# Patient Record
Sex: Female | Born: 1970 | Race: White | Hispanic: No | Marital: Married | Smoking: Never smoker
Health system: Southern US, Community
[De-identification: ages and names within clinical notes are randomized; demographics above are authoritative.]

## PROBLEM LIST (undated history)

## (undated) DIAGNOSIS — F419 Anxiety disorder, unspecified: Secondary | ICD-10-CM

## (undated) DIAGNOSIS — K219 Gastro-esophageal reflux disease without esophagitis: Secondary | ICD-10-CM

## (undated) DIAGNOSIS — I10 Essential (primary) hypertension: Secondary | ICD-10-CM

## (undated) HISTORY — DX: Anxiety disorder, unspecified: F41.9

## (undated) HISTORY — DX: Essential (primary) hypertension: I10

## (undated) HISTORY — PX: WRIST SURGERY: SHX841

## (undated) HISTORY — PX: TUBAL LIGATION: SHX77

## (undated) HISTORY — PX: BREAST SURGERY: SHX581

## (undated) HISTORY — DX: Gastro-esophageal reflux disease without esophagitis: K21.9

---

## 2004-01-08 ENCOUNTER — Ambulatory Visit: Payer: Self-pay

## 2005-03-23 HISTORY — PX: ENDOMETRIAL ABLATION: SHX621

## 2006-01-08 ENCOUNTER — Emergency Department: Payer: Self-pay | Admitting: Emergency Medicine

## 2010-09-19 ENCOUNTER — Ambulatory Visit: Payer: Self-pay | Admitting: Family Medicine

## 2018-06-13 ENCOUNTER — Other Ambulatory Visit: Payer: Self-pay | Admitting: Family Medicine

## 2018-12-23 ENCOUNTER — Other Ambulatory Visit: Payer: Self-pay

## 2018-12-23 DIAGNOSIS — Z20822 Contact with and (suspected) exposure to covid-19: Secondary | ICD-10-CM

## 2018-12-24 LAB — NOVEL CORONAVIRUS, NAA: SARS-CoV-2, NAA: NOT DETECTED

## 2019-01-02 DIAGNOSIS — B9689 Other specified bacterial agents as the cause of diseases classified elsewhere: Secondary | ICD-10-CM | POA: Diagnosis not present

## 2019-01-02 DIAGNOSIS — I10 Essential (primary) hypertension: Secondary | ICD-10-CM | POA: Diagnosis not present

## 2019-01-02 DIAGNOSIS — N898 Other specified noninflammatory disorders of vagina: Secondary | ICD-10-CM | POA: Diagnosis not present

## 2019-01-02 DIAGNOSIS — N760 Acute vaginitis: Secondary | ICD-10-CM | POA: Diagnosis not present

## 2019-01-02 DIAGNOSIS — Z6841 Body Mass Index (BMI) 40.0 and over, adult: Secondary | ICD-10-CM | POA: Diagnosis not present

## 2019-01-09 DIAGNOSIS — I10 Essential (primary) hypertension: Secondary | ICD-10-CM | POA: Diagnosis not present

## 2019-01-09 DIAGNOSIS — Z0000 Encounter for general adult medical examination without abnormal findings: Secondary | ICD-10-CM | POA: Diagnosis not present

## 2019-01-09 DIAGNOSIS — G43809 Other migraine, not intractable, without status migrainosus: Secondary | ICD-10-CM | POA: Diagnosis not present

## 2019-01-09 DIAGNOSIS — Z6841 Body Mass Index (BMI) 40.0 and over, adult: Secondary | ICD-10-CM | POA: Diagnosis not present

## 2019-02-15 ENCOUNTER — Other Ambulatory Visit: Payer: Self-pay

## 2019-02-15 DIAGNOSIS — Z20822 Contact with and (suspected) exposure to covid-19: Secondary | ICD-10-CM

## 2019-02-15 DIAGNOSIS — Z20828 Contact with and (suspected) exposure to other viral communicable diseases: Secondary | ICD-10-CM | POA: Diagnosis not present

## 2019-02-16 LAB — NOVEL CORONAVIRUS, NAA: SARS-CoV-2, NAA: NOT DETECTED

## 2019-04-03 ENCOUNTER — Ambulatory Visit (INDEPENDENT_AMBULATORY_CARE_PROVIDER_SITE_OTHER): Payer: 59 | Admitting: Family Medicine

## 2019-04-03 ENCOUNTER — Encounter: Payer: Self-pay | Admitting: Family Medicine

## 2019-04-03 ENCOUNTER — Other Ambulatory Visit: Payer: Self-pay

## 2019-04-03 VITALS — BP 131/84 | HR 74 | Temp 98.8°F | Ht 60.0 in | Wt 244.0 lb

## 2019-04-03 DIAGNOSIS — K219 Gastro-esophageal reflux disease without esophagitis: Secondary | ICD-10-CM

## 2019-04-03 DIAGNOSIS — F419 Anxiety disorder, unspecified: Secondary | ICD-10-CM

## 2019-04-03 DIAGNOSIS — I10 Essential (primary) hypertension: Secondary | ICD-10-CM

## 2019-04-03 DIAGNOSIS — Z7689 Persons encountering health services in other specified circumstances: Secondary | ICD-10-CM

## 2019-04-03 DIAGNOSIS — R202 Paresthesia of skin: Secondary | ICD-10-CM

## 2019-04-03 DIAGNOSIS — R200 Anesthesia of skin: Secondary | ICD-10-CM

## 2019-04-03 DIAGNOSIS — G4700 Insomnia, unspecified: Secondary | ICD-10-CM

## 2019-04-03 MED ORDER — SUCRALFATE 1 G PO TABS: 1.0000 g | ORAL_TABLET | Freq: Three times a day (TID) | ORAL | 0 refills | Status: DC

## 2019-04-03 MED ORDER — TRAZODONE HCL 50 MG PO TABS: 25.0000 mg | ORAL_TABLET | Freq: Every evening | ORAL | 0 refills | Status: DC | PRN

## 2019-04-03 MED ORDER — PANTOPRAZOLE SODIUM 40 MG PO TBEC: 40.0000 mg | DELAYED_RELEASE_TABLET | Freq: Every day | ORAL | 1 refills | Status: DC

## 2019-04-03 NOTE — Progress Notes (Signed)
BP 131/84   Pulse 74   Temp 98.8 F (37.1 C) (Oral)   Ht 5' (1.524 m)   Wt 244 lb (110.7 kg)   SpO2 98%   BMI 47.65 kg/m    Subjective:    Patient ID: Kari Brandt, female    DOB: 11-Aug-1970, 49 y.o.   MRN: [id number]  HPI: Kari Brandt is a 49 y.o. female  Chief Complaint  Patient presents with  . Establish Care  . Gastroesophageal Reflux   Patient presenting today to establish care.   Dealing with heartburn, indigestion the past 2 weeks. Not taking anything for sxs. Every once in a while has a little bit of brash and belching but never abdominal pain. Sometimes getting nauseated in mornings and throwing up water ut otherwise feeling well.   Right hand itching and numbness, worst when holding objects or laying on that side x 1 month but getting worse. No weakness, color change, known injury, elbow or shoulder pain.   Hx of panic attacks and anxiety, on bad weeks getting 2 per week or so. Has tried hydroxyzine and seroquel in the past but that seemed to keep her awake at night. Has significant insomnia that seems to be resistant to OTC sleep aids as well as these rx's.   HTN - on HCTZ and amlodipine which seems to be controlling things fairly well. Does not check home BPs consistently. Denies CP, SOB, HAs, dizziness, side effects with medications.   Depression screen [facility] 2/9 04/03/2019  Decreased Interest 0  Down, Depressed, Hopeless 0  PHQ - 2 Score 0  Altered sleeping 2  Tired, decreased energy 1  Change in appetite 0  Feeling bad or failure about yourself  0  Trouble concentrating 0  Moving slowly or fidgety/restless 0  Suicidal thoughts 0  PHQ-9 Score 3  No flowsheet data found.  Relevant past medical, surgical, family and social history reviewed and updated as indicated. Interim medical history since our last visit reviewed. Allergies and medications reviewed and updated.  Review of Systems  Per HPI unless specifically indicated above       Objective:    BP 131/84   Pulse 74   Temp 98.8 F (37.1 C) (Oral)   Ht 5' (1.524 m)   Wt 244 lb (110.7 kg)   SpO2 98%   BMI 47.65 kg/m   Wt Readings from Last 3 Encounters:  04/03/19 244 lb (110.7 kg)    Physical Exam Vitals and nursing note reviewed.  Constitutional:      Appearance: Normal appearance. She is not ill-appearing.  HENT:     Head: Atraumatic.  Eyes:     Extraocular Movements: Extraocular movements intact.     Conjunctiva/sclera: Conjunctivae normal.  Cardiovascular:     Rate and Rhythm: Normal rate and regular rhythm.     Heart sounds: Normal heart sounds.  Pulmonary:     Effort: Pulmonary effort is normal.     Breath sounds: Normal breath sounds.  Musculoskeletal:        General: No swelling or tenderness. Normal range of motion.     Cervical back: Normal range of motion and neck supple.  Skin:    General: Skin is warm and dry.  Neurological:     Mental Status: She is alert and oriented to person, place, and time.     Sensory: No sensory deficit.     Motor: No weakness (all 4 extremities full and equal strength).  Psychiatric:  Mood and Affect: Mood normal.        Thought Content: Thought content normal.        Judgment: Judgment normal.     Results for orders placed or performed in visit on 02/15/19  Novel Coronavirus, NAA ([facility])   Specimen: Nasopharyngeal(NP) swabs in vial transport medium   NASOPHARYNGE  TESTING  Result Value Ref Range   SARS-CoV-2, NAA Not Detected Not Detected      Assessment & Plan:   Problem List Items Addressed This Visit      Cardiovascular and Mediastinum   Hypertension    BPs stable and WNL, continue current regimen      Relevant Medications   amLODipine (NORVASC) 5 MG tablet   hydrochlorothiazide (HYDRODIURIL) 25 MG tablet   lisinopril (ZESTRIL) 40 MG tablet     Digestive   GERD (gastroesophageal reflux disease) - Primary    Start protonix and carafate, diet modifications reviewed. F/u if not  resolving      Relevant Medications   pantoprazole (PROTONIX) 40 MG tablet   sucralfate (CARAFATE) 1 g tablet     Other   Anxiety    Start trazodone for sleep and anxiety. Has failed numerous agents in the past so leery about trying new things      Relevant Medications   traZODone (DESYREL) 50 MG tablet   Insomnia    Start trazodone for sleep and anxiety. Has failed numerous agents in the past so leery about trying new things       Other Visit Diagnoses    Encounter to establish care       Numbness and tingling of right arm       Suspect inflammatory. Naproxen, stretches, exercises reviewed. F/u if not resolving       Follow up plan: Return in about 4 weeks (around 05/01/2019) for Anxiety, sleep, and GERD f/u.

## 2019-04-10 DIAGNOSIS — F419 Anxiety disorder, unspecified: Secondary | ICD-10-CM | POA: Insufficient documentation

## 2019-04-10 DIAGNOSIS — I10 Essential (primary) hypertension: Secondary | ICD-10-CM | POA: Insufficient documentation

## 2019-04-10 DIAGNOSIS — G4700 Insomnia, unspecified: Secondary | ICD-10-CM | POA: Insufficient documentation

## 2019-04-10 NOTE — Assessment & Plan Note (Signed)
BPs stable and WNL, continue current regimen 

## 2019-04-10 NOTE — Assessment & Plan Note (Signed)
Start protonix and carafate, diet modifications reviewed. F/u if not resolving

## 2019-04-10 NOTE — Assessment & Plan Note (Signed)
Start trazodone for sleep and anxiety. Has failed numerous agents in the past so leery about trying new things

## 2019-04-10 NOTE — Assessment & Plan Note (Signed)
Start trazodone for sleep and anxiety. Has failed numerous agents in the past so leery about trying new things 

## 2019-04-20 DIAGNOSIS — G43909 Migraine, unspecified, not intractable, without status migrainosus: Secondary | ICD-10-CM | POA: Insufficient documentation

## 2019-05-01 ENCOUNTER — Ambulatory Visit: Payer: Self-pay | Admitting: Family Medicine

## 2019-05-02 ENCOUNTER — Encounter: Payer: Self-pay | Admitting: Family Medicine

## 2019-05-02 ENCOUNTER — Ambulatory Visit: Payer: Self-pay | Admitting: Family Medicine

## 2019-05-02 ENCOUNTER — Telehealth (INDEPENDENT_AMBULATORY_CARE_PROVIDER_SITE_OTHER): Payer: 59 | Admitting: Family Medicine

## 2019-05-02 VITALS — Wt 244.0 lb

## 2019-05-02 DIAGNOSIS — K219 Gastro-esophageal reflux disease without esophagitis: Secondary | ICD-10-CM

## 2019-05-02 DIAGNOSIS — F419 Anxiety disorder, unspecified: Secondary | ICD-10-CM

## 2019-05-02 DIAGNOSIS — N898 Other specified noninflammatory disorders of vagina: Secondary | ICD-10-CM | POA: Diagnosis not present

## 2019-05-02 DIAGNOSIS — G4700 Insomnia, unspecified: Secondary | ICD-10-CM | POA: Diagnosis not present

## 2019-05-02 MED ORDER — TRAZODONE HCL 50 MG PO TABS: 25.0000 mg | ORAL_TABLET | Freq: Every evening | ORAL | 1 refills | Status: DC | PRN

## 2019-05-02 NOTE — Assessment & Plan Note (Signed)
Significant improvement on protonix and prn carafate. Continue prn use of protonix, diet modifications

## 2019-05-02 NOTE — Progress Notes (Signed)
Wt 244 lb (110.7 kg)   BMI 47.65 kg/m    Subjective:    Patient ID: Kari Brandt, female    DOB: 1970-06-07, 49 y.o.   MRN: [id number]  HPI: Kari Brandt is a 49 y.o. female  Chief Complaint  Patient presents with  . Anxiety  . Insomnia  . Gastroesophageal Reflux    . This visit was completed via WebEx due to the restrictions of the COVID-19 pandemic. All issues as above were discussed and addressed. Physical exam was done as above through visual confirmation on WebEx. If it was felt that the patient should be evaluated in the office, they were directed there. The patient verbally consented to this visit. . Location of the patient: home . Location of the provider: home . Those involved with this call:  . Provider: Merrie Roof, PA-C . CMA: Lesle Chris, [place] . Front Desk/Registration: Jill Side  . Time spent on call: 25 minutes with patient face to face via video conference. More than 50% of this time was spent in counseling and coordination of care. 5 minutes total spent in review of patient's record and preparation of their chart. I verified patient identity using two factors (patient name and date of birth). Patient consents verbally to being seen via telemedicine visit today.   Patient following up today on multiple issues.   Taking the trazodone prn at bedtime which seems to work very well for her. Also notes benefit with her anxiety sxs since starting this regimen. Denies side effects, mood concerns.   GERD - significant improvement in sxs with protonix and carafate regimen. No lingering sxs or concerns.   Vaginal discharge with blood, went to walk in clinic several weeks ago for this issue and was treated for BV and yeast with good resolution. Pt mostly concerned because she just dealt with the same issue several months before. Wanting to discuss causes and prevention.   GAD 7 : Generalized Anxiety Score 05/02/2019  Nervous, Anxious, on Edge 1    Control/stop worrying 1  Worry too much - different things 1  Trouble relaxing 2  Restless 0  Easily annoyed or irritable 1  Afraid - awful might happen 0  Total GAD 7 Score 6  Anxiety Difficulty Not difficult at all   Depression screen [facility] 2/9 05/02/2019 04/03/2019  Decreased Interest 0 0  Down, Depressed, Hopeless 0 0  PHQ - 2 Score 0 0  Altered sleeping 1 2  Tired, decreased energy 1 1  Change in appetite 0 0  Feeling bad or failure about yourself  0 0  Trouble concentrating 0 0  Moving slowly or fidgety/restless 0 0  Suicidal thoughts 0 0  PHQ-9 Score 2 3  Difficult doing work/chores Not difficult at all -   Relevant past medical, surgical, family and social history reviewed and updated as indicated. Interim medical history since our last visit reviewed. Allergies and medications reviewed and updated.  Review of Systems  Per HPI unless specifically indicated above     Objective:    Wt 244 lb (110.7 kg)   BMI 47.65 kg/m   Wt Readings from Last 3 Encounters:  05/02/19 244 lb (110.7 kg)  04/03/19 244 lb (110.7 kg)    Physical Exam Vitals and nursing note reviewed.  Constitutional:      General: She is not in acute distress.    Appearance: Normal appearance.  HENT:     Head: Atraumatic.     Right Ear: External ear  normal.     Left Ear: External ear normal.     Nose: Nose normal. No congestion.     Mouth/Throat:     Mouth: Mucous membranes are moist.     Pharynx: Oropharynx is clear. No posterior oropharyngeal erythema.  Eyes:     Extraocular Movements: Extraocular movements intact.     Conjunctiva/sclera: Conjunctivae normal.  Cardiovascular:     Comments: Unable to assess via virtual visit Pulmonary:     Effort: Pulmonary effort is normal. No respiratory distress.  Musculoskeletal:        General: Normal range of motion.     Cervical back: Normal range of motion.  Skin:    General: Skin is dry.     Findings: No erythema.  Neurological:     Mental  Status: She is alert and oriented to person, place, and time.  Psychiatric:        Mood and Affect: Mood normal.        Thought Content: Thought content normal.        Judgment: Judgment normal.     Results for orders placed or performed in visit on 02/15/19  Novel Coronavirus, NAA ([facility])   Specimen: Nasopharyngeal(NP) swabs in vial transport medium   NASOPHARYNGE  TESTING  Result Value Ref Range   SARS-CoV-2, NAA Not Detected Not Detected      Assessment & Plan:   Problem List Items Addressed This Visit      Digestive   GERD (gastroesophageal reflux disease)    Significant improvement on protonix and prn carafate. Continue prn use of protonix, diet modifications        Other   Anxiety    Improved on trazodone, continue current regimen      Relevant Medications   traZODone (DESYREL) 50 MG tablet   Insomnia - Primary    Significant improvement with trazodone nightly prn. Continue current regimen       Other Visit Diagnoses    Vaginal discharge       Discussed probiotics, good vaginal hygiene preventative practices, and to f/u if sxs recurring       Follow up plan: Return in about 6 months (around 10/30/2019) for CPE.

## 2019-05-02 NOTE — Assessment & Plan Note (Signed)
Significant improvement with trazodone nightly prn. Continue current regimen

## 2019-05-02 NOTE — Assessment & Plan Note (Signed)
Improved on trazodone, continue current regimen

## 2019-05-05 ENCOUNTER — Telehealth: Payer: Self-pay | Admitting: Family Medicine

## 2019-05-05 DIAGNOSIS — Z1231 Encounter for screening mammogram for malignant neoplasm of breast: Secondary | ICD-10-CM

## 2019-05-05 NOTE — Telephone Encounter (Signed)
Order placed for mammogram  Copied from CRM [id number]. Topic: Referral - Request for Referral >> May 03, 2019 12:59 PM Lynne Logan D wrote: Has patient seen PCP for this complaint? Yes *If NO, is insurance requiring patient see PCP for this issue before PCP can refer them? Referral for which specialty: Mammography Preferred provider/office: [facility] Reason for referral: Routine

## 2019-05-05 NOTE — Telephone Encounter (Signed)
There is no DPR in pts chart. Lvm for pt to call back.

## 2019-05-08 ENCOUNTER — Other Ambulatory Visit: Payer: Self-pay | Admitting: Family Medicine

## 2019-05-08 DIAGNOSIS — Z1231 Encounter for screening mammogram for malignant neoplasm of breast: Secondary | ICD-10-CM

## 2019-05-09 NOTE — Telephone Encounter (Signed)
LVM for pt to call back.

## 2019-05-11 ENCOUNTER — Telehealth: Payer: Self-pay | Admitting: Family Medicine

## 2019-05-11 MED ORDER — METRONIDAZOLE 0.75 % VA GEL: 1.0000 | Freq: Two times a day (BID) | VAGINAL | 0 refills | Status: DC

## 2019-05-11 NOTE — Telephone Encounter (Signed)
Pt calling stating her vaginal discharge and BV sxs have returned post tx. Will send over flagyl gel and tx for several weeks. F/u in clinic if not resolving

## 2019-05-17 ENCOUNTER — Ambulatory Visit
Admission: RE | Admit: 2019-05-17 | Discharge: 2019-05-17 | Disposition: A | Discharge status: Home / Self Care | Payer: 59 | Source: Ambulatory Visit | Attending: Family Medicine | Admitting: Family Medicine

## 2019-05-17 ENCOUNTER — Other Ambulatory Visit: Payer: Self-pay

## 2019-05-17 ENCOUNTER — Other Ambulatory Visit: Payer: Self-pay | Admitting: Family Medicine

## 2019-05-17 DIAGNOSIS — R2231 Localized swelling, mass and lump, right upper limb: Secondary | ICD-10-CM

## 2019-05-17 DIAGNOSIS — Z1231 Encounter for screening mammogram for malignant neoplasm of breast: Secondary | ICD-10-CM | POA: Diagnosis present

## 2019-05-17 DIAGNOSIS — R928 Other abnormal and inconclusive findings on diagnostic imaging of breast: Secondary | ICD-10-CM

## 2019-05-19 ENCOUNTER — Telehealth: Payer: Self-pay | Admitting: Family Medicine

## 2019-05-19 NOTE — Telephone Encounter (Signed)
Called pt, discussed findings from mammogram of possible mass in right axilla. Discussed that [facility] would be reaching out to discuss results and get her scheduled for f/u ultrasound of right axilla, and without this further imaging we don't really know much based on the mammogram results. Reassurance given, questions answered. Recommended she reach out to [facility] directly to schedule u/s as she had not yet heard from them but to call us Monday afternoon if she still hadn't heard anything from them.

## 2019-05-19 NOTE — Telephone Encounter (Signed)
Routing to provider  

## 2019-05-19 NOTE — Telephone Encounter (Signed)
Copied from CRM [id number]. Topic: General - Other >> May 19, 2019 12:30 PM Tamela Oddi wrote: Reason for CRM: Patient called to discuss her mammogram results with her doctor asap.  Please advise and call back at [phone]

## 2019-05-19 NOTE — Telephone Encounter (Signed)
Already reached out to pt regarding these questions, see other telephone encounter for details

## 2019-05-19 NOTE — Telephone Encounter (Signed)
Patient's husband is calling to ask Fleet Contras about [facility] [employer] [facility]. Something was identified in the scan. Wanting to know what the next step is? Please advise [phone]

## 2019-05-23 ENCOUNTER — Ambulatory Visit
Admission: RE | Admit: 2019-05-23 | Discharge: 2019-05-23 | Disposition: A | Discharge status: Home / Self Care | Payer: 59 | Source: Ambulatory Visit | Attending: Family Medicine | Admitting: Family Medicine

## 2019-05-23 DIAGNOSIS — R928 Other abnormal and inconclusive findings on diagnostic imaging of breast: Secondary | ICD-10-CM

## 2019-05-23 DIAGNOSIS — R2231 Localized swelling, mass and lump, right upper limb: Secondary | ICD-10-CM | POA: Diagnosis present

## 2019-06-28 ENCOUNTER — Ambulatory Visit: Payer: Self-pay | Admitting: *Deleted

## 2019-06-28 NOTE — Telephone Encounter (Addendum)
Pt and her husband called stating that EMS was called to the pt's job today because her she felt tired, and liight headed; her BP per the fire dept was 150/?, sitting, and 160/? Standing; at 1100 per EMS her BP was 105/64 sitting, and 94/50 standing; the pt says that she has not monitored her BP at home; she complains of fatigue that started on 06/24/19, but worsened today; she says that an EKG was also obtained;she was told to contact her PCP; she is currently not light headed; the pt reports that she did take her BP meds this AM; recommendations made per nurse triage protocol; she verbalized understanding; she sees Roosvelt Maser, [facility], but she has no appt within guidelines; pt offered and accepted with [facility], 06/29/19 at 0930; pt also inquired if she could return to work today; advised pt that this needs to be discussed with her provider; she verbalized understanding, and says that her best contact # is [phone] ; will route to office for notification.  Reason for Disposition . [1] Systolic BP 90-110 AND [2] taking blood pressure medications AND [3] NOT dizzy, lightheaded or weak  Answer Assessment - Initial Assessment Questions 1. BLOOD PRESSURE: "What is the blood pressure?" "Did you take at least two measurements 5 minutes apart?"     105/64 and 94/50 2. ONSET: "When did you take your blood pressure?"     06/28/19 at 1100 and 1105 per EMS 3. HOW: "How did you obtain the blood pressure?" (e.g., visiting nurse, automatic home BP monitor)   Obtained per EMS 4. HISTORY: "Do you have a history of low blood pressure?" "What is your blood pressure normally?"     No; pt takes meds for htn  5. MEDICATIONS: "Are you taking any medications for blood pressure?" If yes: "Have they been changed recently?"    Yes, no changes recently 6. PULSE RATE: "Do you know what your pulse rate is?"     84 7. OTHER SYMPTOMS: "Have you been sick recently?" "Have you had a recent injury?"    No, no 8.  PREGNANCY: "Is there any chance you are pregnant?" "When was your last menstrual period?"    No; LMP 06/10/19  Protocols used: LOW BLOOD PRESSURE-A-AH

## 2019-06-29 ENCOUNTER — Other Ambulatory Visit: Payer: Self-pay

## 2019-06-29 ENCOUNTER — Ambulatory Visit (INDEPENDENT_AMBULATORY_CARE_PROVIDER_SITE_OTHER): Payer: 59 | Admitting: Nurse Practitioner

## 2019-06-29 ENCOUNTER — Encounter: Payer: Self-pay | Admitting: Nurse Practitioner

## 2019-06-29 VITALS — BP 115/75 | HR 75 | Temp 98.1°F | Ht 60.0 in | Wt 247.6 lb

## 2019-06-29 DIAGNOSIS — E559 Vitamin D deficiency, unspecified: Secondary | ICD-10-CM | POA: Diagnosis not present

## 2019-06-29 DIAGNOSIS — R5383 Other fatigue: Secondary | ICD-10-CM

## 2019-06-29 DIAGNOSIS — I959 Hypotension, unspecified: Secondary | ICD-10-CM | POA: Diagnosis not present

## 2019-06-29 LAB — UA/M W/RFLX CULTURE, ROUTINE
Bilirubin, UA: NEGATIVE
Glucose, UA: NEGATIVE
Leukocytes,UA: NEGATIVE
Nitrite, UA: NEGATIVE
Protein,UA: NEGATIVE
RBC, UA: NEGATIVE
Specific Gravity, UA: 1.025 (ref 1.005–1.030)
Urobilinogen, Ur: 1 mg/dL (ref 0.2–1.0)
pH, UA: 5.5 (ref 5.0–7.5)

## 2019-06-29 NOTE — Assessment & Plan Note (Addendum)
Acute, ongoing.  Advised to start checking BP at home at least daily and keep a record of the readings.  Unclear cause of hypotension, especially since has been on present BP medications for ~ 3 years.  ?hypoglycemic episode vs. Hypovolemic/dehydrated.  CBC, CMP, TSH, Vitamin D, and UA checked today.  EKG in office within normal limits. Follow up early next week to re-assess blood pressure; Instructed to HOLD blood pressure medications until follow up next week.

## 2019-06-29 NOTE — Progress Notes (Signed)
BP 115/75 (BP Location: Left Arm)   Pulse 75   Temp 98.1 F (36.7 C) (Oral)   Ht 5' (1.524 m)   Wt 247 lb 9.6 oz (112.3 kg)   SpO2 97%   BMI 48.36 kg/m  LMP 06/10/2019; tubal ligation  Subjective:    Patient ID: Kari Brandt, female    DOB: 1971-01-29, 49 y.o.   MRN: [id number]  HPI: Kari Brandt is a 49 y.o. female presenting with blood pressure problem.  Chief Complaint  Patient presents with  . Blood pressure problem    Patient states yesterday while she was at work, she felt light headed. Fire department came and initially her systolic was 621 (doesn't remember dystolic). When EMT arrived her blood pressure was 105/64 sitting-then 94/50 standing.     Reports yesterday she was feeling really tired and lightheaded and extremely hungry.  She never passed out, but the Fire Department was called.  She reports that the Fire department came and checked her blood pressure and it was high.  EMS was also called and reports their readings were significant different than the Fire Department.  She has not taken any of her daily blood pressure medications today.  She does report some sweating during the episode yesterday, but states she "runs hot."  She also endorses some constipation recently.  HYPERTENSION Hypertension status: controlled  Satisfied with current treatment? yes Duration of hypertension: chronic BP monitoring frequency:  not checking BP medication side effects:  no Medication compliance: excellent compliance Previous BP meds:amlodipine and lisinopril-HCTZ Aspirin: no Recurrent headaches: no Visual changes: no Palpitations: no Dyspnea: no Chest pain: no Lower extremity edema: no Dizzy/lightheaded: no  No Known Allergies  Outpatient Encounter Medications as of 06/29/2019  Medication Sig  . amLODipine (NORVASC) 5 MG tablet TAKE 1 TABLET BY MOUTH ONCE DAILY FOR HIGH BLOOD PRESSURE  . hydrochlorothiazide (HYDRODIURIL) 25 MG tablet TAKE 1 TABLET BY MOUTH  ONCE DAILY FOR HIGH BLOOD PRESSURE  . lisinopril (ZESTRIL) 40 MG tablet TAKE 1 TABLET BY MOUTH ONCE DAILY FOR HIGH BLOOD PRESSURE  . naproxen sodium (ALEVE) 220 MG tablet Take by mouth as needed.   . traZODone (DESYREL) 50 MG tablet Take 0.5-1 tablets (25-50 mg total) by mouth at bedtime as needed for sleep.  . pantoprazole (PROTONIX) 40 MG tablet Take 1 tablet (40 mg total) by mouth daily. (Patient not taking: Reported on 06/29/2019)  . sucralfate (CARAFATE) 1 g tablet Take 1 tablet (1 g total) by mouth 4 (four) times daily -  with meals and at bedtime. Can dissolve 1 tablet in full glass of water and drink entire glass if desired (Patient not taking: Reported on 06/29/2019)  . [DISCONTINUED] metroNIDAZOLE (METROGEL VAGINAL) 0.75 % vaginal gel Place 1 Applicatorful vaginally 2 (two) times daily. (Patient not taking: Reported on 06/29/2019)   No facility-administered encounter medications on file as of 06/29/2019.   Patient Active Problem List   Diagnosis Date Noted  . Hypotension 06/29/2019  . Hypertension   . Anxiety   . Insomnia   . GERD (gastroesophageal reflux disease)    Past Medical History:  Diagnosis Date  . Anxiety   . GERD (gastroesophageal reflux disease)   . Hypertension    Relevant past medical, surgical, family and social history reviewed and updated as indicated. Interim medical history since our last visit reviewed.  Review of Systems  Constitutional: Positive for diaphoresis and fatigue. Negative for appetite change, chills and fever.  HENT: Negative.  Negative for congestion,  sinus pressure and sinus pain.   Eyes: Negative.  Negative for visual disturbance.  Respiratory: Negative.  Negative for cough, shortness of breath and wheezing.   Cardiovascular: Negative.  Negative for chest pain and palpitations.  Gastrointestinal: Positive for constipation. Negative for blood in stool, diarrhea, nausea and vomiting.  Endocrine: Negative.  Negative for polydipsia, polyphagia and  polyuria.  Genitourinary: Negative.  Negative for dysuria, frequency, hematuria and urgency.  Skin: Negative.  Negative for color change and pallor.  Neurological: Positive for dizziness. Negative for weakness, light-headedness, numbness and headaches.  Psychiatric/Behavioral: Negative.  Negative for agitation and confusion. The patient is not nervous/anxious.    Per HPI unless specifically indicated above     Objective:    BP 115/75 (BP Location: Left Arm)   Pulse 75   Temp 98.1 F (36.7 C) (Oral)   Ht 5' (1.524 m)   Wt 247 lb 9.6 oz (112.3 kg)   SpO2 97%   BMI 48.36 kg/m   Wt Readings from Last 3 Encounters:  06/29/19 247 lb 9.6 oz (112.3 kg)  05/02/19 244 lb (110.7 kg)  04/03/19 244 lb (110.7 kg)    Orthostatic VS for the past 24 hrs:  BP- Lying Pulse- Lying BP- Sitting Pulse- Sitting BP- Standing at 0 minutes Pulse- Standing at 0 minutes  06/29/19 0945 126/79 74 106/69 76 116/75 83   Physical Exam Vitals and nursing note reviewed.  Constitutional:      Appearance: Normal appearance. She is obese.  HENT:     Head: Normocephalic and atraumatic.  Eyes:     General: No scleral icterus.    Extraocular Movements: Extraocular movements intact.  Cardiovascular:     Rate and Rhythm: Normal rate and regular rhythm.     Pulses: Normal pulses.     Heart sounds: Normal heart sounds. No murmur.  Pulmonary:     Effort: Pulmonary effort is normal. No respiratory distress.     Breath sounds: Normal breath sounds. No wheezing or rhonchi.  Abdominal:     General: Abdomen is flat. Bowel sounds are normal. There is no distension.     Palpations: Abdomen is soft.     Tenderness: There is no abdominal tenderness.  Musculoskeletal:        General: No swelling. Normal range of motion.     Cervical back: Normal range of motion. No rigidity.     Right lower leg: No edema.     Left lower leg: No edema.  Lymphadenopathy:     Cervical: No cervical adenopathy.  Skin:    General: Skin is  warm and dry.     Capillary Refill: Capillary refill takes less than 2 seconds.     Coloration: Skin is not jaundiced or pale.  Neurological:     General: No focal deficit present.     Mental Status: She is alert and oriented to person, place, and time.     Motor: No weakness.     Gait: Gait normal.  Psychiatric:        Mood and Affect: Mood normal.        Behavior: Behavior normal.        Thought Content: Thought content normal.        Judgment: Judgment normal.       Assessment & Plan:   Problem List Items Addressed This Visit      Cardiovascular and Mediastinum   Hypotension - Primary    Acute, ongoing.  Advised to start checking BP at  home at least daily and keep a record of the readings.  Unclear cause of hypotension, especially since has been on present BP medications for ~ 3 years.  ?hypoglycemic episode vs. Hypovolemic/dehydrated.  CBC, CMP, TSH, Vitamin D, and UA checked today.  EKG in office within normal limits. Follow up early next week to re-assess blood pressure; Instructed to HOLD blood pressure medications until follow up next week.      Relevant Orders   CBC with Differential/Platelet   Comprehensive metabolic panel   UA/M w/rflx Culture, Routine   EKG 12-Lead (Completed)    Other Visit Diagnoses    Vitamin D deficiency       Relevant Orders   Vitamin D (25 hydroxy)   Fatigue, unspecified type       Relevant Orders   CBC with Differential/Platelet   Comprehensive metabolic panel   TSH   UA/M w/rflx Culture, Routine   Vitamin D (25 hydroxy)       Follow up plan: Return in about 5 days (around 07/04/2019) for bp check, discuss dizziness.

## 2019-06-29 NOTE — Patient Instructions (Signed)
Hypotension As your heart beats, it forces blood through your body. Hypotension, commonly called low blood pressure, is when the force of blood pumping through your arteries is too weak. Arteries are blood vessels that carry blood from the heart throughout the body. Depending on the cause and severity, hypotension may be harmless (benign) or may cause serious problems (be critical). When blood pressure is too low, you may not get enough blood to your brain or to the rest of your organs. This can cause weakness, light-headedness, rapid heartbeat, and fainting. What are the causes? This condition may be caused by:  Blood loss.  Loss of body fluids (dehydration).  Heart problems.  Hormone (endocrine) problems.  Pregnancy.  Severe infection.  Lack of certain nutrients.  Severe allergic reactions (anaphylaxis).  Certain medicines, such as blood pressure medicine or medicines that make the body lose excess fluids (diuretics). Sometimes, hypotension may be caused by not taking medicine as directed, such as taking too much of a certain medicine. What increases the risk? The following factors may make you more likely to develop this condition:  Age. Risk increases as you get older.  Conditions that affect the heart or the central nervous system.  Taking certain medicines, such as blood pressure medicine or diuretics.  Being pregnant. What are the signs or symptoms? Common symptoms of this condition include:  Weakness.  Light-headedness.  Dizziness.  Blurred vision.  Fatigue.  Rapid heartbeat.  Fainting, in severe cases. How is this diagnosed? This condition is diagnosed based on:  Your medical history.  Your symptoms.  Your blood pressure measurement. Your health care provider will check your blood pressure when you are: ? Lying down. ? Sitting. ? Standing. A blood pressure reading is recorded as two numbers, such as "120 over 80" (or 120/80). The first ("top")  number is called the systolic pressure. It is a measure of the pressure in your arteries as your heart beats. The second ("bottom") number is called the diastolic pressure. It is a measure of the pressure in your arteries when your heart relaxes between beats. Blood pressure is measured in a unit called mm Hg. Healthy blood pressure for most adults is 120/80. If your blood pressure is below 90/60, you may be diagnosed with hypotension. Other information or tests that may be used to diagnose hypotension include:  Your other vital signs, such as your heart rate and temperature.  Blood tests.  Tilt table test. For this test, you will be safely secured to a table that moves you from a lying position to an upright position. Your heart rhythm and blood pressure will be monitored during the test. How is this treated? Treatment for this condition may include:  Changing your diet. This may involve eating more salt (sodium) or drinking more water.  Taking medicines to raise your blood pressure.  Changing the dosage of certain medicines you are taking that might be lowering your blood pressure.  Wearing compression stockings. These stockings help to prevent blood clots and reduce swelling in your legs. In some cases, you may need to go to the hospital for:  Fluid replacement. This means you will receive fluids through an IV.  Blood replacement. This means you will receive donated blood through an IV (transfusion).  Treating an infection or heart problems, if this applies.  Monitoring. You may need to be monitored while medicines that you are taking wear off. Follow these instructions at home: Eating and drinking   Drink enough fluid to keep your   urine pale yellow.  Eat a healthy diet, and follow instructions from your health care provider about eating or drinking restrictions. A healthy diet includes: ? Fresh fruits and vegetables. ? Whole grains. ? Lean meats. ? Low-fat dairy  products.  Eat extra salt only as directed. Do not add extra salt to your diet unless your health care provider told you to do that.  Eat frequent, small meals.  Avoid standing up suddenly after eating. Medicines  Take over-the-counter and prescription medicines only as told by your health care provider. ? Follow instructions from your health care provider about changing the dosage of your current medicines, if this applies. ? Do not stop or adjust any of your medicines on your own. General instructions   Wear compression stockings as told by your health care provider.  Get up slowly from lying down or sitting positions. This gives your blood pressure a chance to adjust.  Avoid hot showers and excessive heat as directed by your health care provider.  Return to your normal activities as told by your health care provider. Ask your health care provider what activities are safe for you.  Do not use any products that contain nicotine or tobacco, such as cigarettes, e-cigarettes, and chewing tobacco. If you need help quitting, ask your health care provider.  Keep all follow-up visits as told by your health care provider. This is important. Contact a health care provider if you:  Vomit.  Have diarrhea.  Have a fever for more than 2-3 days.  Feel more thirsty than usual.  Feel weak and tired. Get help right away if you:  Have chest pain.  Have a fast or irregular heartbeat.  Develop numbness in any part of your body.  Cannot move your arms or your legs.  Have trouble speaking.  Become sweaty or feel light-headed.  Faint.  Feel short of breath.  Have trouble staying awake.  Feel confused. Summary  Hypotension is when the force of blood pumping through your arteries is too weak.  Hypotension may be harmless (benign) or may cause serious problems (be critical).  Treatment for this condition may include changing your diet, changing your medicines, and wearing  compression stockings.  In some cases, you may need to go to the hospital for fluid or blood replacement. This information is not intended to replace advice given to you by your health care provider. Make sure you discuss any questions you have with your health care provider. Document Revised: 09/02/2017 Document Reviewed: 09/02/2017 Elsevier Patient Education  2020 [employer]vier [employer]  

## 2019-06-30 ENCOUNTER — Encounter: Payer: Self-pay | Admitting: Nurse Practitioner

## 2019-06-30 ENCOUNTER — Telehealth: Payer: Self-pay | Admitting: Nurse Practitioner

## 2019-06-30 DIAGNOSIS — E559 Vitamin D deficiency, unspecified: Secondary | ICD-10-CM

## 2019-06-30 DIAGNOSIS — R748 Abnormal levels of other serum enzymes: Secondary | ICD-10-CM

## 2019-06-30 LAB — COMPREHENSIVE METABOLIC PANEL
ALT: 27 IU/L (ref 0–32)
AST: 21 IU/L (ref 0–40)
Albumin/Globulin Ratio: 1.7 (ref 1.2–2.2)
Albumin: 4.5 g/dL (ref 3.8–4.8)
Alkaline Phosphatase: 141 IU/L — ABNORMAL HIGH (ref 39–117)
BUN/Creatinine Ratio: 18 (ref 9–23)
BUN: 12 mg/dL (ref 6–24)
Bilirubin Total: 0.3 mg/dL (ref 0.0–1.2)
CO2: 25 mmol/L (ref 20–29)
Calcium: 9.1 mg/dL (ref 8.7–10.2)
Chloride: 108 mmol/L — ABNORMAL HIGH (ref 96–106)
Creatinine, Ser: 0.66 mg/dL (ref 0.57–1.00)
GFR calc Af Amer: 123 mL/min/{1.73_m2} (ref >59)
GFR calc non Af Amer: 106 mL/min/{1.73_m2} (ref >59)
Globulin, Total: 2.7 g/dL (ref 1.5–4.5)
Glucose: 96 mg/dL (ref 65–99)
Potassium: 4.8 mmol/L (ref 3.5–5.2)
Sodium: 145 mmol/L — ABNORMAL HIGH (ref 134–144)
Total Protein: 7.2 g/dL (ref 6.0–8.5)

## 2019-06-30 LAB — VITAMIN D 25 HYDROXY (VIT D DEFICIENCY, FRACTURES): Vit D, 25-Hydroxy: 15.3 ng/mL — ABNORMAL LOW (ref 30.0–100.0)

## 2019-06-30 LAB — CBC WITH DIFFERENTIAL/PLATELET
Basophils Absolute: 0 10*3/uL (ref 0.0–0.2)
Basos: 0 %
EOS (ABSOLUTE): 0.2 10*3/uL (ref 0.0–0.4)
Eos: 3 %
Hematocrit: 39.9 % (ref 34.0–46.6)
Hemoglobin: 12.8 g/dL (ref 11.1–15.9)
Immature Grans (Abs): 0.1 10*3/uL (ref 0.0–0.1)
Immature Granulocytes: 1 %
Lymphocytes Absolute: 2.7 10*3/uL (ref 0.7–3.1)
Lymphs: 30 %
MCH: 28.8 pg (ref 26.6–33.0)
MCHC: 32.1 g/dL (ref 31.5–35.7)
MCV: 90 fL (ref 79–97)
Monocytes Absolute: 0.6 10*3/uL (ref 0.1–0.9)
Monocytes: 6 %
Neutrophils Absolute: 5.4 10*3/uL (ref 1.4–7.0)
Neutrophils: 60 %
Platelets: 348 10*3/uL (ref 150–450)
RBC: 4.44 x10E6/uL (ref 3.77–5.28)
RDW: 13.1 % (ref 11.7–15.4)
WBC: 9 10*3/uL (ref 3.4–10.8)

## 2019-06-30 LAB — TSH: TSH: 1.62 u[IU]/mL (ref 0.450–4.500)

## 2019-06-30 NOTE — Telephone Encounter (Signed)
CMP and Vitamin D ordered for recheck.

## 2019-07-06 ENCOUNTER — Ambulatory Visit (INDEPENDENT_AMBULATORY_CARE_PROVIDER_SITE_OTHER): Payer: 59 | Admitting: Family Medicine

## 2019-07-06 ENCOUNTER — Other Ambulatory Visit: Payer: Self-pay

## 2019-07-06 ENCOUNTER — Encounter: Payer: Self-pay | Admitting: Family Medicine

## 2019-07-06 VITALS — BP 134/88 | HR 79 | Temp 98.7°F | Wt 245.0 lb

## 2019-07-06 DIAGNOSIS — R42 Dizziness and giddiness: Secondary | ICD-10-CM

## 2019-07-06 DIAGNOSIS — Z23 Encounter for immunization: Secondary | ICD-10-CM

## 2019-07-06 DIAGNOSIS — I10 Essential (primary) hypertension: Secondary | ICD-10-CM | POA: Diagnosis not present

## 2019-07-06 NOTE — Progress Notes (Signed)
BP 134/88   Pulse 79   Temp 98.7 F (37.1 C) (Oral)   Wt 245 lb (111.1 kg)   SpO2 98%   BMI 47.85 kg/m    Subjective:    Patient ID: Kari Brandt, female    DOB: 02-Sep-1970, 49 y.o.   MRN: [id number]  HPI: Kari Brandt is a 49 y.o. female  Chief Complaint  Patient presents with  . Hypertension  . Dizziness   Presenting today for dizziness f/u from last week. Her dizziness was thought to be from overtreatment of BPs, with benign exam, EKG, and labs last week. Home BPs since holding BP regimen x 1 week have been running 140s-160s/80 range. Denies CP, SOB, HAs, dizziness since holding. No other concerns today.   Relevant past medical, surgical, family and social history reviewed and updated as indicated. Interim medical history since our last visit reviewed. Allergies and medications reviewed and updated.  Review of Systems  Per HPI unless specifically indicated above     Objective:    BP 134/88   Pulse 79   Temp 98.7 F (37.1 C) (Oral)   Wt 245 lb (111.1 kg)   SpO2 98%   BMI 47.85 kg/m   Wt Readings from Last 3 Encounters:  07/06/19 245 lb (111.1 kg)  06/29/19 247 lb 9.6 oz (112.3 kg)  05/02/19 244 lb (110.7 kg)    Physical Exam Vitals and nursing note reviewed.  Constitutional:      Appearance: Normal appearance. She is not ill-appearing.  HENT:     Head: Atraumatic.  Eyes:     Extraocular Movements: Extraocular movements intact.     Conjunctiva/sclera: Conjunctivae normal.  Cardiovascular:     Rate and Rhythm: Normal rate and regular rhythm.     Heart sounds: Normal heart sounds.  Pulmonary:     Effort: Pulmonary effort is normal.     Breath sounds: Normal breath sounds.  Musculoskeletal:        General: Normal range of motion.     Cervical back: Normal range of motion and neck supple.  Skin:    General: Skin is warm and dry.  Neurological:     Mental Status: She is alert and oriented to person, place, and time.  Psychiatric:        Mood and Affect: Mood normal.        Thought Content: Thought content normal.        Judgment: Judgment normal.     Results for orders placed or performed in visit on 06/29/19  CBC with Differential/Platelet  Result Value Ref Range   WBC 9.0 3.4 - 10.8 x10E3/uL   RBC 4.44 3.77 - 5.28 x10E6/uL   Hemoglobin 12.8 11.1 - 15.9 g/dL   Hematocrit 39.9 34.0 - 46.6 %   MCV 90 79 - 97 fL   MCH 28.8 26.6 - 33.0 pg   MCHC 32.1 31.5 - 35.7 g/dL   RDW 13.1 11.7 - 15.4 %   Platelets 348 150 - 450 x10E3/uL   Neutrophils 60 Not Estab. %   Lymphs 30 Not Estab. %   Monocytes 6 Not Estab. %   Eos 3 Not Estab. %   Basos 0 Not Estab. %   Neutrophils Absolute 5.4 1.4 - 7.0 x10E3/uL   Lymphocytes Absolute 2.7 0.7 - 3.1 x10E3/uL   Monocytes Absolute 0.6 0.1 - 0.9 x10E3/uL   EOS (ABSOLUTE) 0.2 0.0 - 0.4 x10E3/uL   Basophils Absolute 0.0 0.0 - 0.2 x10E3/uL   Immature  Granulocytes 1 Not Estab. %   Immature Grans (Abs) 0.1 0.0 - 0.1 x10E3/uL  Comprehensive metabolic panel  Result Value Ref Range   Glucose 96 65 - 99 mg/dL   BUN 12 6 - 24 mg/dL   Creatinine, Ser 5.88 0.57 - 1.00 mg/dL   GFR calc non Af Amer 106 >59 mL/min/1.73   GFR calc Af Amer 123 >59 mL/min/1.73   BUN/Creatinine Ratio 18 9 - 23   Sodium 145 (H) 134 - 144 mmol/L   Potassium 4.8 3.5 - 5.2 mmol/L   Chloride 108 (H) 96 - 106 mmol/L   CO2 25 20 - 29 mmol/L   Calcium 9.1 8.7 - 10.2 mg/dL   Total Protein 7.2 6.0 - 8.5 g/dL   Albumin 4.5 3.8 - 4.8 g/dL   Globulin, Total 2.7 1.5 - 4.5 g/dL   Albumin/Globulin Ratio 1.7 1.2 - 2.2   Bilirubin Total 0.3 0.0 - 1.2 mg/dL   Alkaline Phosphatase 141 (H) 39 - 117 IU/L   AST 21 0 - 40 IU/L   ALT 27 0 - 32 IU/L  TSH  Result Value Ref Range   TSH 1.620 0.450 - 4.500 uIU/mL  UA/M w/rflx Culture, Routine   Specimen: Urine   URINE  Result Value Ref Range   Specific Gravity, UA 1.025 1.005 - 1.030   pH, UA 5.5 5.0 - 7.5   Color, UA Yellow Yellow   Appearance Ur Clear Clear    Leukocytes,UA Negative Negative   Protein,UA Negative Negative/Trace   Glucose, UA Negative Negative   Ketones, UA Trace (A) Negative   RBC, UA Negative Negative   Bilirubin, UA Negative Negative   Urobilinogen, Ur 1.0 0.2 - 1.0 mg/dL   Nitrite, UA Negative Negative  Vitamin D (25 hydroxy)  Result Value Ref Range   Vit D, 25-Hydroxy 15.3 (L) 30.0 - 100.0 ng/mL      Assessment & Plan:   Problem List Items Addressed This Visit      Cardiovascular and Mediastinum   Hypertension - Primary    BPs elevated off all 3 medications, will slowly re-add until back consistently at goal. Start by adding HCTZ back in, monitor home readings, call with elevated readings prior to 1 month f/u. Continue hold of lisinopril and amlodipine       Other Visit Diagnoses    Dizziness       Resolved with hold of BP regimen, suspect related to hypotension. Continue close monitoring as slowly adding medication back on for better BP control   Need for Tdap vaccination       Relevant Orders   Tdap vaccine greater than or equal to 7yo IM (Completed)       Follow up plan: Return in about 4 weeks (around 08/03/2019) for HTN.

## 2019-07-06 NOTE — Patient Instructions (Signed)
Start taking back your hydrochlorothiazide but keep holding off on the other two medications for blood pressure

## 2019-07-06 NOTE — Assessment & Plan Note (Signed)
BPs elevated off all 3 medications, will slowly re-add until back consistently at goal. Start by adding HCTZ back in, monitor home readings, call with elevated readings prior to 1 month f/u. Continue hold of lisinopril and amlodipine

## 2019-07-19 ENCOUNTER — Other Ambulatory Visit: Payer: Self-pay | Admitting: Family Medicine

## 2019-07-19 ENCOUNTER — Encounter: Payer: Self-pay | Admitting: Family Medicine

## 2019-07-19 MED ORDER — HYDROCHLOROTHIAZIDE 25 MG PO TABS: 25.0000 mg | ORAL_TABLET | Freq: Every day | ORAL | 1 refills | Status: DC

## 2019-08-03 ENCOUNTER — Other Ambulatory Visit: Payer: Self-pay

## 2019-08-03 ENCOUNTER — Ambulatory Visit (INDEPENDENT_AMBULATORY_CARE_PROVIDER_SITE_OTHER): Payer: Self-pay | Admitting: Family Medicine

## 2019-08-03 ENCOUNTER — Encounter: Payer: Self-pay | Admitting: Family Medicine

## 2019-08-03 VITALS — BP 129/84 | HR 75 | Temp 98.4°F | Ht 60.71 in | Wt 239.5 lb

## 2019-08-03 DIAGNOSIS — I10 Essential (primary) hypertension: Secondary | ICD-10-CM

## 2019-08-03 NOTE — Progress Notes (Signed)
BP 129/84   Pulse 75   Temp 98.4 F (36.9 C)   Ht 5' 0.71" (1.542 m)   Wt 239 lb 8 oz (108.6 kg)   LMP 07/11/2019 (Exact Date)   SpO2 97%   BMI 45.69 kg/m    Subjective:    Patient ID: Kari Brandt, female    DOB: 1970-09-26, 49 y.o.   MRN: [id number]  HPI: Kari Brandt is a 49 y.o. female  Chief Complaint  Patient presents with  . Hypertension   Presenting today for HTN f/u. Had been having dizzy spells that were thought to be related to hypotensive episodes, so all three blood pressure medications had been d/c'd and now HCTZ added back in. Tolerating this well without dizzy spells. Denies Cp, SOB, HAs, syncope. Home BPs running consistently 120s/80s. Trying to eat better and be more active.   Relevant past medical, surgical, family and social history reviewed and updated as indicated. Interim medical history since our last visit reviewed. Allergies and medications reviewed and updated.  Review of Systems  Per HPI unless specifically indicated above     Objective:    BP 129/84   Pulse 75   Temp 98.4 F (36.9 C)   Ht 5' 0.71" (1.542 m)   Wt 239 lb 8 oz (108.6 kg)   LMP 07/11/2019 (Exact Date)   SpO2 97%   BMI 45.69 kg/m   Wt Readings from Last 3 Encounters:  08/03/19 239 lb 8 oz (108.6 kg)  07/06/19 245 lb (111.1 kg)  06/29/19 247 lb 9.6 oz (112.3 kg)    Physical Exam Vitals and nursing note reviewed.  Constitutional:      Appearance: Normal appearance. She is not ill-appearing.  HENT:     Head: Atraumatic.  Eyes:     Extraocular Movements: Extraocular movements intact.     Conjunctiva/sclera: Conjunctivae normal.  Cardiovascular:     Rate and Rhythm: Normal rate and regular rhythm.     Heart sounds: Normal heart sounds.  Pulmonary:     Effort: Pulmonary effort is normal.     Breath sounds: Normal breath sounds.  Musculoskeletal:        General: Normal range of motion.     Cervical back: Normal range of motion and neck supple.   Skin:    General: Skin is warm and dry.  Neurological:     Mental Status: She is alert and oriented to person, place, and time.  Psychiatric:        Mood and Affect: Mood normal.        Thought Content: Thought content normal.        Judgment: Judgment normal.     Results for orders placed or performed in visit on 06/29/19  CBC with Differential/Platelet  Result Value Ref Range   WBC 9.0 3.4 - 10.8 x10E3/uL   RBC 4.44 3.77 - 5.28 x10E6/uL   Hemoglobin 12.8 11.1 - 15.9 g/dL   Hematocrit 39.9 34.0 - 46.6 %   MCV 90 79 - 97 fL   MCH 28.8 26.6 - 33.0 pg   MCHC 32.1 31.5 - 35.7 g/dL   RDW 13.1 11.7 - 15.4 %   Platelets 348 150 - 450 x10E3/uL   Neutrophils 60 Not Estab. %   Lymphs 30 Not Estab. %   Monocytes 6 Not Estab. %   Eos 3 Not Estab. %   Basos 0 Not Estab. %   Neutrophils Absolute 5.4 1.4 - 7.0 x10E3/uL   Lymphocytes Absolute  2.7 0.7 - 3.1 x10E3/uL   Monocytes Absolute 0.6 0.1 - 0.9 x10E3/uL   EOS (ABSOLUTE) 0.2 0.0 - 0.4 x10E3/uL   Basophils Absolute 0.0 0.0 - 0.2 x10E3/uL   Immature Granulocytes 1 Not Estab. %   Immature Grans (Abs) 0.1 0.0 - 0.1 x10E3/uL  Comprehensive metabolic panel  Result Value Ref Range   Glucose 96 65 - 99 mg/dL   BUN 12 6 - 24 mg/dL   Creatinine, Ser 9.37 0.57 - 1.00 mg/dL   GFR calc non Af Amer 106 >59 mL/min/1.73   GFR calc Af Amer 123 >59 mL/min/1.73   BUN/Creatinine Ratio 18 9 - 23   Sodium 145 (H) 134 - 144 mmol/L   Potassium 4.8 3.5 - 5.2 mmol/L   Chloride 108 (H) 96 - 106 mmol/L   CO2 25 20 - 29 mmol/L   Calcium 9.1 8.7 - 10.2 mg/dL   Total Protein 7.2 6.0 - 8.5 g/dL   Albumin 4.5 3.8 - 4.8 g/dL   Globulin, Total 2.7 1.5 - 4.5 g/dL   Albumin/Globulin Ratio 1.7 1.2 - 2.2   Bilirubin Total 0.3 0.0 - 1.2 mg/dL   Alkaline Phosphatase 141 (H) 39 - 117 IU/L   AST 21 0 - 40 IU/L   ALT 27 0 - 32 IU/L  TSH  Result Value Ref Range   TSH 1.620 0.450 - 4.500 uIU/mL  UA/M w/rflx Culture, Routine   Specimen: Urine   URINE  Result  Value Ref Range   Specific Gravity, UA 1.025 1.005 - 1.030   pH, UA 5.5 5.0 - 7.5   Color, UA Yellow Yellow   Appearance Ur Clear Clear   Leukocytes,UA Negative Negative   Protein,UA Negative Negative/Trace   Glucose, UA Negative Negative   Ketones, UA Trace (A) Negative   RBC, UA Negative Negative   Bilirubin, UA Negative Negative   Urobilinogen, Ur 1.0 0.2 - 1.0 mg/dL   Nitrite, UA Negative Negative  Vitamin D (25 hydroxy)  Result Value Ref Range   Vit D, 25-Hydroxy 15.3 (L) 30.0 - 100.0 ng/mL      Assessment & Plan:   Problem List Items Addressed This Visit      Cardiovascular and Mediastinum   Hypertension - Primary    BPs stable and under good control, continue current regimen with close home monitoring          Follow up plan: Return in about 6 months (around 02/03/2020) for CPE.

## 2019-08-09 NOTE — Assessment & Plan Note (Signed)
BPs stable and under good control, continue current regimen with close home monitoring

## 2019-11-06 ENCOUNTER — Ambulatory Visit (LOCAL_COMMUNITY_HEALTH_CENTER): Payer: Self-pay

## 2019-11-06 ENCOUNTER — Encounter: Payer: Medicaid Other | Admitting: Family Medicine

## 2019-11-06 ENCOUNTER — Other Ambulatory Visit: Payer: Self-pay

## 2019-11-06 DIAGNOSIS — Z111 Encounter for screening for respiratory tuberculosis: Secondary | ICD-10-CM

## 2019-11-09 ENCOUNTER — Ambulatory Visit (LOCAL_COMMUNITY_HEALTH_CENTER): Payer: BC Managed Care – PPO | Admitting: Family Medicine

## 2019-11-09 ENCOUNTER — Other Ambulatory Visit: Payer: Self-pay

## 2019-11-09 DIAGNOSIS — Z111 Encounter for screening for respiratory tuberculosis: Secondary | ICD-10-CM

## 2019-11-09 LAB — TB SKIN TEST
Induration: 0 mm
TB Skin Test: NEGATIVE

## 2019-12-04 ENCOUNTER — Ambulatory Visit (INDEPENDENT_AMBULATORY_CARE_PROVIDER_SITE_OTHER): Payer: BC Managed Care – PPO | Admitting: Obstetrics and Gynecology

## 2019-12-04 ENCOUNTER — Encounter: Payer: Self-pay | Admitting: Obstetrics and Gynecology

## 2019-12-04 ENCOUNTER — Other Ambulatory Visit: Payer: Self-pay

## 2019-12-04 VITALS — BP 120/80 | Ht 61.0 in | Wt 236.0 lb

## 2019-12-04 DIAGNOSIS — N939 Abnormal uterine and vaginal bleeding, unspecified: Secondary | ICD-10-CM | POA: Diagnosis not present

## 2019-12-04 NOTE — Progress Notes (Signed)
Particia N[place], [place]   Chief Complaint  Patient presents with  . Vaginal Bleeding    hasnt stopped bleeding since 8/24, bad cramping, heavy flow    HPI:      Ms. Kari Brandt is a 49 y.o. No obstetric history on file. whose LMP was Patient's last menstrual period was 11/14/2019 (exact date)., presents today for NP eval of AUB this cycle. Menses are usually monthly, last 4 days, mod to heavy flow, no BTB, mild dysmen, improved with ibup. Pt had normal menses 5/21, then no menses till 8/21. Bleeding hasn't stopped since. Started to lighten but would then have heavy gushes, soiling clothes. Seems to be tapering off now. Has had worse dysmen compared to normal. Rarely skips a period. S/p Her Option about 2007/2008 without any period relief. Has occas vasomotor sx. Normal thyroid 4/21. Normal pap at [facility] last yr per pt report.   She is sex active, no dyspareunia/bleeding. Pt is s/p TL.  No recent sickness, travel.   Past Medical History:  Diagnosis Date  . Anxiety   . GERD (gastroesophageal reflux disease)   . Hypertension     Past Surgical History:  Procedure Laterality Date  . ENDOMETRIAL ABLATION  2007   her option  . TUBAL LIGATION    . WRIST SURGERY      Family History  Problem Relation Age of Onset  . Diabetes Mother   . Heart disease Mother   . Kidney disease Mother   . Anxiety disorder Sister   . Heart disease Maternal Grandmother   . Breast cancer Neg Hx     Social History   Socioeconomic History  . Marital status: Married    Spouse name: Not on file  . Number of children: Not on file  . Years of education: Not on file  . Highest education level: Not on file  Occupational History  . Not on file  Tobacco Use  . Smoking status: Never Smoker  . Smokeless tobacco: Never Used  Vaping Use  . Vaping Use: Never used  Substance and Sexual Activity  . Alcohol use: Yes    Comment: occassionally  . Drug use: Never  . Sexual activity: Yes     Birth control/protection: None, Surgical    Comment: Tubal Ligation  Other Topics Concern  . Not on file  Social History Narrative  . Not on file   Social Determinants of Health   Financial Resource Strain:   . Difficulty of Paying Living Expenses: Not on file  Food Insecurity:   . Worried About Programme researcher, broadcasting/film/video in the Last Year: Not on file  . Ran Out of Food in the Last Year: Not on file  Transportation Needs:   . Lack of Transportation (Medical): Not on file  . Lack of Transportation (Non-Medical): Not on file  Physical Activity:   . Days of Exercise per Week: Not on file  . Minutes of Exercise per Session: Not on file  Stress:   . Feeling of Stress : Not on file  Social Connections:   . Frequency of Communication with Friends and Family: Not on file  . Frequency of Social Gatherings with Friends and Family: Not on file  . Attends Religious Services: Not on file  . Active Member of Clubs or Organizations: Not on file  . Attends Banker Meetings: Not on file  . Marital Status: Not on file  Intimate Partner Violence:   . Fear of Current or  Ex-Partner: Not on file  . Emotionally Abused: Not on file  . Physically Abused: Not on file  . Sexually Abused: Not on file    Outpatient Medications Prior to Visit  Medication Sig Dispense Refill  . hydrochlorothiazide (HYDRODIURIL) 25 MG tablet Take 1 tablet (25 mg total) by mouth daily. for high blood pressure 90 tablet 1  . naproxen sodium (ALEVE) 220 MG tablet Take by mouth as needed.     . traZODone (DESYREL) 50 MG tablet Take 0.5-1 tablets (25-50 mg total) by mouth at bedtime as needed for sleep. 90 tablet 1   No facility-administered medications prior to visit.      ROS:  Review of Systems  Constitutional: Negative for fever.  Gastrointestinal: Negative for blood in stool, constipation, diarrhea, nausea and vomiting.  Genitourinary: Positive for menstrual problem. Negative for dyspareunia, dysuria, flank  pain, frequency, hematuria, urgency, vaginal bleeding, vaginal discharge and vaginal pain.  Musculoskeletal: Negative for back pain.  Skin: Negative for rash.   BREAST: No symptoms   OBJECTIVE:   Vitals:  BP 120/80   Ht 5\' 1"  (1.549 m)   Wt 236 lb (107 kg)   LMP 11/14/2019 (Exact Date)   BMI 44.59 kg/m   Physical Exam Vitals reviewed.  Constitutional:      Appearance: She is well-developed.  Pulmonary:     Effort: Pulmonary effort is normal.  Genitourinary:    General: Normal vulva.     Pubic Area: No rash.      Labia:        Right: No rash, tenderness or lesion.        Left: No rash, tenderness or lesion.      Vagina: Normal. No vaginal discharge, erythema or tenderness.     Cervix: Normal.     Uterus: Normal. Not enlarged and not tender.      Adnexa: Right adnexa normal and left adnexa normal.       Right: No mass or tenderness.         Left: No mass or tenderness.    Musculoskeletal:        General: Normal range of motion.     Cervical back: Normal range of motion.  Skin:    General: Skin is warm and dry.  Neurological:     General: No focal deficit present.     Mental Status: She is alert and oriented to person, place, and time.  Psychiatric:        Mood and Affect: Mood normal.        Behavior: Behavior normal.        Thought Content: Thought content normal.        Judgment: Judgment normal.     Assessment/Plan: Abnormal uterine bleeding (AUB) - Plan: 11/16/2019 PELVIS TRANSVAGINAL NON-OB (TV ONLY) ; minimal bleeding on exam, tapering off. Check GYN u/s. Will f/u with results. If WNL, will follow cycles expectantly. Pt to f/u if sx worsen before u/s for Rx aygestin prn.     Return in about 2 days (around 12/06/2019) for GYN u/s for AUB--[facility] to call pt.  Kasiya Burck B. Shaquetta Arcos, PA-C 12/04/2019 3:21 PM

## 2019-12-04 NOTE — Patient Instructions (Signed)
I value your feedback and entrusting us with your care. If you get a [facility] patient survey, I would appreciate you taking the time to let us know about your experience today. Thank you!  As of March 02, 2019, your lab results will be released to your MyChart immediately, before I even have a chance to see them. Please give me time to review them and contact you if there are any abnormalities. Thank you for your patience.  

## 2019-12-08 ENCOUNTER — Ambulatory Visit (INDEPENDENT_AMBULATORY_CARE_PROVIDER_SITE_OTHER): Payer: BC Managed Care – PPO

## 2019-12-08 ENCOUNTER — Other Ambulatory Visit: Payer: Self-pay | Admitting: Obstetrics and Gynecology

## 2019-12-08 ENCOUNTER — Other Ambulatory Visit: Payer: Self-pay

## 2019-12-08 DIAGNOSIS — N939 Abnormal uterine and vaginal bleeding, unspecified: Secondary | ICD-10-CM

## 2019-12-12 ENCOUNTER — Telehealth: Payer: Self-pay

## 2019-12-12 NOTE — Telephone Encounter (Signed)
Pt returning call from yesterday about u/s results. Pt states if we can call after [phone]

## 2019-12-12 NOTE — Telephone Encounter (Signed)
Pt aware of GYN u/s results. Neg except thickened EM=14 mm. Pt missed 3 periods and then had 3 wk long menses, heavy flow at times. Bleeding has since stopped.  Discussed possibility of polyp. Can eval with [facility], hysteroscopy in office, or hysteroscopy/D&C. Pt elects to watch next cycle since bleeding has stopped. If sx persist, will eval further.   ULTRASOUND REPORT  Location: [facility]  Date of Service: 12/08/2019    Indications:Abnormal Uterine Bleeding   Findings:  The uterus is anteverted and measures 10.6 x 6.9 x 6.1 cm. Echo texture is heterogenous without evidence of focal masses. The Endometrium measures 14.0 mm. The endometrium is heterogeneous.   Neither ovary is visible.  Survey of the adnexa demonstrates no adnexal masses. There is no free fluid in the cul de sac.  Impression: 1. The endometrium is heterogeneous.  2. An endometrium polyp can not be ruled out.  3. Neither ovary is visible.  4. There is some complex blood within the cervical canal near the external os measuring 21.8 x 3.3 x 12.8 mm.   Recommendations: 1.Clinical correlation with the patient's History and Physical Exam.   Deanna Artis, RT

## 2019-12-30 DIAGNOSIS — N946 Dysmenorrhea, unspecified: Secondary | ICD-10-CM | POA: Diagnosis not present

## 2020-01-12 DIAGNOSIS — Z20822 Contact with and (suspected) exposure to covid-19: Secondary | ICD-10-CM | POA: Diagnosis not present

## 2020-02-04 ENCOUNTER — Encounter: Payer: Self-pay | Admitting: Nurse Practitioner

## 2020-02-04 DIAGNOSIS — Z6841 Body Mass Index (BMI) 40.0 and over, adult: Secondary | ICD-10-CM | POA: Insufficient documentation

## 2020-02-04 DIAGNOSIS — E559 Vitamin D deficiency, unspecified: Secondary | ICD-10-CM | POA: Insufficient documentation

## 2020-02-07 ENCOUNTER — Ambulatory Visit (INDEPENDENT_AMBULATORY_CARE_PROVIDER_SITE_OTHER): Payer: BC Managed Care – PPO | Admitting: Family Medicine

## 2020-02-07 ENCOUNTER — Encounter: Payer: Self-pay | Admitting: Family Medicine

## 2020-02-07 ENCOUNTER — Other Ambulatory Visit: Payer: Self-pay

## 2020-02-07 VITALS — BP 151/89 | HR 69 | Temp 98.6°F | Ht 61.0 in | Wt 236.2 lb

## 2020-02-07 DIAGNOSIS — Z6841 Body Mass Index (BMI) 40.0 and over, adult: Secondary | ICD-10-CM

## 2020-02-07 DIAGNOSIS — K219 Gastro-esophageal reflux disease without esophagitis: Secondary | ICD-10-CM

## 2020-02-07 DIAGNOSIS — I10 Essential (primary) hypertension: Secondary | ICD-10-CM

## 2020-02-07 DIAGNOSIS — F419 Anxiety disorder, unspecified: Secondary | ICD-10-CM

## 2020-02-07 DIAGNOSIS — Z1159 Encounter for screening for other viral diseases: Secondary | ICD-10-CM

## 2020-02-07 DIAGNOSIS — Z0000 Encounter for general adult medical examination without abnormal findings: Secondary | ICD-10-CM

## 2020-02-07 MED ORDER — PANTOPRAZOLE SODIUM 40 MG PO TBEC: 40.0000 mg | DELAYED_RELEASE_TABLET | Freq: Every day | ORAL | 3 refills | Status: DC | PRN

## 2020-02-07 MED ORDER — HYDROCHLOROTHIAZIDE 25 MG PO TABS
25.0000 mg | ORAL_TABLET | Freq: Every day | ORAL | 1 refills | Status: DC
Start: 2020-02-07 — End: 2020-08-15

## 2020-02-07 NOTE — Patient Instructions (Signed)
It was great to see you!  Our plans for today:  - We sent refills of your medications to the pharmacy. - Come back in one month for recheck of your blood pressure. - Work on incorporating more vegetables into your diet. Work on increasing your physical activity. Losing weight will also help your acid reflux.  - We will try to get the records from your recent pap smear.  - Take a calcium and vitamin D supplement.   We are checking some labs today, we will release these to your MyChart.   Take care and seek immediate care sooner if you develop any concerns.   Dr. Linwood Dibbles  Here is an example of what a healthy plate looks like:    ? Make half your plate fruits and vegetables.     ? Focus on whole fruits.     ? Vary your veggies.  ? Make half your grains whole grains. -     ? Look for the word "whole" at the beginning of the ingredients list    ? Some whole-grain ingredients include whole oats, whole-wheat flour,        whole-grain corn, whole-grain brown rice, and whole rye.  ? Move to low-fat and fat-free milk or yogurt.  ? Vary your protein routine. - Meat, fish, poultry (chicken, [place]), eggs, beans (kidney, pinto), dairy.  ? Drink and eat less sodium, saturated fat, and added sugars.

## 2020-02-07 NOTE — Assessment & Plan Note (Signed)
Controlled with prn protonix, refill provided. Counseling provided regarding lifestyle modifications such as avoiding triggering foods and weight loss.

## 2020-02-07 NOTE — Assessment & Plan Note (Signed)
Controlled on current regimen. No changes made today.

## 2020-02-07 NOTE — Assessment & Plan Note (Signed)
Uncontrolled today though has been out of meds. Refills provided today. F/u in one month for recheck and labs.

## 2020-02-07 NOTE — Assessment & Plan Note (Signed)
Counseling provided on lifestyle modifications through diet changes and increasing physical activity, see AVS for details.

## 2020-02-07 NOTE — Progress Notes (Signed)
BP (!) 151/89 (BP Location: Left Arm, Cuff Size: Normal)   Pulse 69   Temp 98.6 F (37 C) (Oral)   Ht 5\' 1"  (1.549 m)   Wt 236 lb 3.2 oz (107.1 kg)   LMP 02/06/2020   SpO2 98%   BMI 44.63 kg/m    Subjective:    Patient ID: Kari Brandt, female    DOB: 14-May-1970, 49 y.o.   MRN: [id number]  HPI: Lissett Favorite is a 49 y.o. female presenting on 02/07/2020 for comprehensive medical examination. Current medical complaints include:none  She currently lives with: Menopausal Symptoms: unsure, occasional hot flashes.. Mom passed at 55yo, unsure of when she went through menopause. A bit more emotional.   Hypertension: - Medications: HCTZ 25mg  daily - Compliance: good but ran out last week. Has not taken any today. - Checking BP at home: yes, 120-140s SBP - Denies any SOB, CP, vision changes, LE edema, medication SEs, or symptoms of hypotension - Diet: not much fruit/vegetable intake. - Exercise: none formally. Active at work (childcare provider)  GERD - Medications: protonix 40mg  prn - Symptoms: heartburn prn - Denies: chest pain, choking on food, cough, difficulty swallowing, dysphagia, early satiety, melena, nausea and regurgitation of undigested food.  - does not smoke.  - worse with sausage, [employer]  Abnormal Uterine Bleeding - G3P3003 - Menses: usually every 4 days. Went 3 months w/o menses this summer, returned in August, heavy. Currently menstruating, heavy. - Contraception: tubal ligation - Cancer screening: UTD, at [facility] normal per patient report - previously saw GYN with pelvic 01-17-1976 notable for endometrial polyp, recommended f/u if wishes to proceed with hysteroscopy and D&C. - has not tried NSAIDs.  Anxiety/Insomnia - Medications: trazodone 25-50mg  at bedtime prn - Taking: every once in a while - Status: improved - Coping Mechanisms: deep breathing, talking to others.   Depression Screen done today and results listed below:  Depression screen  [facility] 2/9 02/07/2020 05/02/2019 04/03/2019  Decreased Interest 0 0 0  Down, Depressed, Hopeless 0 0 0  PHQ - 2 Score 0 0 0  Altered sleeping - 1 2  Tired, decreased energy - 1 1  Change in appetite - 0 0  Feeling bad or failure about yourself  - 0 0  Trouble concentrating - 0 0  Moving slowly or fidgety/restless - 0 0  Suicidal thoughts - 0 0  PHQ-9 Score - 2 3  Difficult doing work/chores - Not difficult at all -    The patient does not have a history of falls. I did not complete a risk assessment for falls. A plan of care for falls was not documented.   Past Medical History:  Past Medical History:  Diagnosis Date  . Anxiety   . GERD (gastroesophageal reflux disease)   . Hypertension     Surgical History:  Past Surgical History:  Procedure Laterality Date  . ENDOMETRIAL ABLATION  2007   her option  . TUBAL LIGATION    . WRIST SURGERY      Medications:  Current Outpatient Medications on File Prior to Visit  Medication Sig  . Ascorbic Acid (VITAMIN C) 1000 MG tablet Take 1,000 mg by mouth daily.  . ASPIRIN 81 PO Take 1 tablet by mouth daily.  . traZODone (DESYREL) 50 MG tablet Take 0.5-1 tablets (25-50 mg total) by mouth at bedtime as needed for sleep.   No current facility-administered medications on file prior to visit.    Allergies:  No Known Allergies  Social History:  Social History   Socioeconomic History  . Marital status: Married    Spouse name: Not on file  . Number of children: Not on file  . Years of education: Not on file  . Highest education level: Not on file  Occupational History  . Not on file  Tobacco Use  . Smoking status: Never Smoker  . Smokeless tobacco: Never Used  Vaping Use  . Vaping Use: Never used  Substance and Sexual Activity  . Alcohol use: Yes    Comment: occassionally  . Drug use: Never  . Sexual activity: Yes    Birth control/protection: None, Surgical    Comment: Tubal Ligation  Other Topics Concern  . Not on file    Social History Narrative  . Not on file   Social Determinants of Health   Financial Resource Strain:   . Difficulty of Paying Living Expenses: Not on file  Food Insecurity:   . Worried About Programme researcher, broadcasting/film/video in the Last Year: Not on file  . Ran Out of Food in the Last Year: Not on file  Transportation Needs:   . Lack of Transportation (Medical): Not on file  . Lack of Transportation (Non-Medical): Not on file  Physical Activity:   . Days of Exercise per Week: Not on file  . Minutes of Exercise per Session: Not on file  Stress:   . Feeling of Stress : Not on file  Social Connections:   . Frequency of Communication with Friends and Family: Not on file  . Frequency of Social Gatherings with Friends and Family: Not on file  . Attends Religious Services: Not on file  . Active Member of Clubs or Organizations: Not on file  . Attends Banker Meetings: Not on file  . Marital Status: Not on file  Intimate Partner Violence:   . Fear of Current or Ex-Partner: Not on file  . Emotionally Abused: Not on file  . Physically Abused: Not on file  . Sexually Abused: Not on file   Social History   Tobacco Use  Smoking Status Never Smoker  Smokeless Tobacco Never Used   Social History   Substance and Sexual Activity  Alcohol Use Yes   Comment: occassionally    Family History:  Family History  Problem Relation Age of Onset  . Diabetes Mother   . Heart disease Mother   . Kidney disease Mother   . Anxiety disorder Sister   . Heart disease Maternal Grandmother   . Breast cancer Neg Hx    Past medical history, surgical history, medications, allergies, family history and social history reviewed with patient today and changes made to appropriate areas of the chart.   Review of Systems - General ROS: negative for - fatigue, fever, night sweats or weight loss All other ROS negative except what is listed above and in the HPI.      Objective:    BP (!) 151/89 (BP  Location: Left Arm, Cuff Size: Normal)   Pulse 69   Temp 98.6 F (37 C) (Oral)   Ht 5\' 1"  (1.549 m)   Wt 236 lb 3.2 oz (107.1 kg)   LMP 02/06/2020   SpO2 98%   BMI 44.63 kg/m   Wt Readings from Last 3 Encounters:  02/07/20 236 lb 3.2 oz (107.1 kg)  12/04/19 236 lb (107 kg)  08/03/19 239 lb 8 oz (108.6 kg)    Physical Exam Vitals reviewed.  Constitutional:      Appearance:  She is obese. She is not ill-appearing.  HENT:     Head: Normocephalic.     Right Ear: External ear normal.     Left Ear: External ear normal.     Nose: Nose normal.     Mouth/Throat:     Mouth: Mucous membranes are moist.  Eyes:     Extraocular Movements: Extraocular movements intact.  Cardiovascular:     Rate and Rhythm: Normal rate and regular rhythm.     Heart sounds: Normal heart sounds.  Pulmonary:     Effort: Pulmonary effort is normal.     Breath sounds: Normal breath sounds. No wheezing or rales.  Abdominal:     General: Bowel sounds are normal.     Palpations: Abdomen is soft.     Tenderness: There is no abdominal tenderness. There is no guarding.  Musculoskeletal:        General: Normal range of motion.     Right lower leg: No edema.     Left lower leg: No edema.  Skin:    General: Skin is warm and dry.  Neurological:     Mental Status: She is alert and oriented to person, place, and time. Mental status is at baseline.  Psychiatric:        Mood and Affect: Mood normal.        Behavior: Behavior normal.     Results for orders placed or performed in visit on 11/06/19  TB Skin Test  Result Value Ref Range   TB Skin Test Negative    Induration 0 mm      Assessment & Plan:   Problem List Items Addressed This Visit      Cardiovascular and Mediastinum   Hypertension    Uncontrolled today though has been out of meds. Refills provided today. F/u in one month for recheck and labs.       Relevant Medications   ASPIRIN 81 PO   hydrochlorothiazide (HYDRODIURIL) 25 MG tablet      Digestive   GERD (gastroesophageal reflux disease)    Controlled with prn protonix, refill provided. Counseling provided regarding lifestyle modifications such as avoiding triggering foods and weight loss.      Relevant Medications   pantoprazole (PROTONIX) 40 MG tablet     Other   Anxiety    Controlled on current regimen. No changes made today.      BMI 40.0-44.9, adult [facility])    Counseling provided on lifestyle modifications through diet changes and increasing physical activity, see AVS for details.       Other Visit Diagnoses    Well adult exam    -  Primary   Need for hepatitis C screening test       Relevant Orders   Hepatitis C antibody       Follow up plan: Return in about 4 weeks (around 03/06/2020) for HTN.   LABORATORY TESTING:  - Pap smear: done elsewhere, [facility] few years ago per patient report  IMMUNIZATIONS:   - Tdap: Tetanus vaccination status reviewed: last tetanus booster within 10 years. - Influenza: Refused - Pneumovax: Not applicable - Prevnar: Not applicable - HPV: Not applicable - Shingrix vaccine: Not applicable  SCREENING: - Mammogram: Up to date, due 05/2020  - Colonoscopy: Not applicable  - Bone Density: Not applicable  -Hearing Test: Not applicable  -Spirometry: Not applicable   PATIENT COUNSELING:   Advised to take 1 mg of folate supplement per day if capable of pregnancy.   Sexuality: Discussed  sexually transmitted diseases, partner selection, use of condoms, avoidance of unintended pregnancy  and contraceptive alternatives.   Advised to avoid cigarette smoking.  I discussed with the patient that most people either abstain from alcohol or drink within safe limits (<=14/week and <=4 drinks/occasion for males, <=7/weeks and <= 3 drinks/occasion for females) and that the risk for alcohol disorders and other health effects rises proportionally with the number of drinks per week and how often a drinker exceeds daily limits.  Discussed  cessation/primary prevention of drug use and availability of treatment for abuse.   Diet: Encouraged to adjust caloric intake to maintain  or achieve ideal body weight, to reduce intake of dietary saturated fat and total fat, to limit sodium intake by avoiding high sodium foods and not adding table salt, and to maintain adequate dietary potassium and calcium preferably from fresh fruits, vegetables, and low-fat dairy products.    stressed the importance of regular exercise  Injury prevention: Discussed safety belts, safety helmets, smoke detector, smoking near bedding or upholstery.   Dental health: Discussed importance of regular tooth brushing, flossing, and dental visits.    NEXT PREVENTATIVE PHYSICAL DUE IN 1 YEAR.  Return in about 4 weeks (around 03/06/2020) for HTN.

## 2020-02-08 LAB — HEPATITIS C ANTIBODY: Hep C Virus Ab: 0.2 s/co ratio (ref 0.0–0.9)

## 2020-03-06 ENCOUNTER — Encounter: Payer: Self-pay | Admitting: Family Medicine

## 2020-03-06 ENCOUNTER — Ambulatory Visit (INDEPENDENT_AMBULATORY_CARE_PROVIDER_SITE_OTHER): Payer: BC Managed Care – PPO | Admitting: Family Medicine

## 2020-03-06 ENCOUNTER — Other Ambulatory Visit: Payer: Self-pay

## 2020-03-06 VITALS — BP 140/80 | HR 76 | Temp 98.3°F | Wt 231.0 lb

## 2020-03-06 DIAGNOSIS — I10 Essential (primary) hypertension: Secondary | ICD-10-CM | POA: Diagnosis not present

## 2020-03-06 DIAGNOSIS — J069 Acute upper respiratory infection, unspecified: Secondary | ICD-10-CM | POA: Diagnosis not present

## 2020-03-06 NOTE — Patient Instructions (Signed)
It was great to see you!  Our plans for today:  - No changes to your medications today.  - Come back in 2 months for recheck.   We are checking some labs today, we will release these results to your MyChart.  You have a cold and it should start to get better about 7 - 10 days after it started.    For your cough, try delsym and honey.   For your nasal congestion and runny nose, try using Afrin (generic is Oxymetazoline) twice daily for 3 days.  Do not use for longer that 3 days.    Some other therapies you can try are: push fluids, rest, gargle warm salt water, use vaporizer or mist as needed and return office visit as needed if symptoms persist or worsen.   Drinking warm liquids such as teas and soups can help with secretions and cough. A mist humidifier or vaporizer can work well to help with secretions and cough.  It is very important to clean the humidifier between use according to the instructions.    It was good to see you.  If you're still having trouble in the next week, come back and see [place].    Of course, if you start having trouble breathing, worsening fevers, vomiting and unable to hold down any fluids, or you have other concerns, don't hesitate to come back or go to the ED after hours.   Dr. Linwood Dibbles

## 2020-03-06 NOTE — Assessment & Plan Note (Signed)
Slightly elevated in office today, likely due to current viral URI and decongestant use given normal readings at home. No med changes made today. Will obtain labs today and reassess in 2 months. If remains elevated in office with normal home readings, consider ambulatory BP monitoring to assess BP patterns.

## 2020-03-06 NOTE — Progress Notes (Signed)
    SUBJECTIVE:   CHIEF COMPLAINT / HPI:   Past Medical History:  Diagnosis Date  . Anxiety   . GERD (gastroesophageal reflux disease)   . Hypertension    Hypertension: - Medications: HCTZ 25mg  daily - Compliance: good - Checking BP at home: not often, SBP 120s  - Denies any SOB, CP, vision changes, LE edema, medication SEs, or symptoms of hypotension  UPPER RESPIRATORY TRACT INFECTION Worst symptom: cough Fever: no Cough: yes, phlegm Shortness of breath: no Wheezing: no Chest pain: no Chest tightness: no Chest congestion: yes Nasal congestion: yes Post nasal drip: yes Sneezing: yes Sore throat: yes Sinus pressure: no Headache: no Face pain: no Ear pain: no   Ear pressure: no   Eyes red/itching:no Eye drainage/crusting: no  Vomiting: no Rash: no 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 No med changes made today. Will obtain labs today and reassess in 2 months. If remains elevated in office with normal home readings, consider ambulatory BP monitoring to assess BP patterns.  Viral URI Symptoms and exam most consistent with viral URI especially daycare work setting. Well appearing and well hydrated on exam, afebrile. Supportive care including OTC symptom relief, maintaining adequate oral hydration, honey/delsym for cough. Return precautions reviewed, see AVS for details.    02/08/2020, DO

## 2020-03-07 LAB — BASIC METABOLIC PANEL
BUN/Creatinine Ratio: 13 (ref 9–23)
BUN: 12 mg/dL (ref 6–24)
CO2: 25 mmol/L (ref 20–29)
Calcium: 9.2 mg/dL (ref 8.7–10.2)
Chloride: 103 mmol/L (ref 96–106)
Creatinine, Ser: 0.93 mg/dL (ref 0.57–1.00)
GFR calc Af Amer: 85 mL/min/{1.73_m2} (ref >59)
GFR calc non Af Amer: 73 mL/min/{1.73_m2} (ref >59)
Glucose: 113 mg/dL — ABNORMAL HIGH (ref 65–99)
Potassium: 4 mmol/L (ref 3.5–5.2)
Sodium: 143 mmol/L (ref 134–144)

## 2020-07-09 ENCOUNTER — Observation Stay
Admission: EM | Admit: 2020-07-09 | Discharge: 2020-07-10 | Disposition: A | Discharge status: Home / Self Care | Payer: BC Managed Care – PPO | Attending: Internal Medicine | Admitting: Internal Medicine

## 2020-07-09 ENCOUNTER — Other Ambulatory Visit: Payer: Self-pay

## 2020-07-09 ENCOUNTER — Telehealth: Payer: Self-pay

## 2020-07-09 ENCOUNTER — Encounter: Payer: Self-pay | Admitting: Emergency Medicine

## 2020-07-09 DIAGNOSIS — R0789 Other chest pain: Secondary | ICD-10-CM | POA: Diagnosis not present

## 2020-07-09 DIAGNOSIS — R778 Other specified abnormalities of plasma proteins: Secondary | ICD-10-CM | POA: Diagnosis not present

## 2020-07-09 DIAGNOSIS — Z20822 Contact with and (suspected) exposure to covid-19: Secondary | ICD-10-CM | POA: Insufficient documentation

## 2020-07-09 DIAGNOSIS — Z79899 Other long term (current) drug therapy: Secondary | ICD-10-CM | POA: Diagnosis not present

## 2020-07-09 DIAGNOSIS — R079 Chest pain, unspecified: Secondary | ICD-10-CM | POA: Diagnosis not present

## 2020-07-09 DIAGNOSIS — F419 Anxiety disorder, unspecified: Secondary | ICD-10-CM | POA: Diagnosis present

## 2020-07-09 DIAGNOSIS — K219 Gastro-esophageal reflux disease without esophagitis: Secondary | ICD-10-CM | POA: Diagnosis present

## 2020-07-09 DIAGNOSIS — I10 Essential (primary) hypertension: Secondary | ICD-10-CM | POA: Diagnosis not present

## 2020-07-09 DIAGNOSIS — Z6841 Body Mass Index (BMI) 40.0 and over, adult: Secondary | ICD-10-CM

## 2020-07-09 LAB — CBC WITH DIFFERENTIAL/PLATELET
Abs Immature Granulocytes: 0.04 10*3/uL (ref 0.00–0.07)
Basophils Absolute: 0 10*3/uL (ref 0.0–0.1)
Basophils Relative: 1 %
Eosinophils Absolute: 0.2 10*3/uL (ref 0.0–0.5)
Eosinophils Relative: 2 %
HCT: 40.4 % (ref 36.0–46.0)
Hemoglobin: 13.6 g/dL (ref 12.0–15.0)
Immature Granulocytes: 1 %
Lymphocytes Relative: 29 %
Lymphs Abs: 2.5 10*3/uL (ref 0.7–4.0)
MCH: 29 pg (ref 26.0–34.0)
MCHC: 33.7 g/dL (ref 30.0–36.0)
MCV: 86.1 fL (ref 80.0–100.0)
Monocytes Absolute: 0.5 10*3/uL (ref 0.1–1.0)
Monocytes Relative: 6 %
Neutro Abs: 5.2 10*3/uL (ref 1.7–7.7)
Neutrophils Relative %: 61 %
Platelets: 355 10*3/uL (ref 150–400)
RBC: 4.69 MIL/uL (ref 3.87–5.11)
RDW: 13.6 % (ref 11.5–15.5)
WBC: 8.4 10*3/uL (ref 4.0–10.5)
nRBC: 0 % (ref 0.0–0.2)

## 2020-07-09 LAB — BASIC METABOLIC PANEL
Anion gap: 8 (ref 5–15)
BUN: 13 mg/dL (ref 6–20)
CO2: 25 mmol/L (ref 22–32)
Calcium: 9.2 mg/dL (ref 8.9–10.3)
Chloride: 107 mmol/L (ref 98–111)
Creatinine, Ser: 0.67 mg/dL (ref 0.44–1.00)
GFR, Estimated: >60 mL/min (ref >60)
Glucose, Bld: 116 mg/dL — ABNORMAL HIGH (ref 70–99)
Potassium: 3.7 mmol/L (ref 3.5–5.1)
Sodium: 140 mmol/L (ref 135–145)

## 2020-07-09 LAB — TROPONIN I (HIGH SENSITIVITY)
Troponin I (High Sensitivity): 29 ng/L — ABNORMAL HIGH (ref <18)
Troponin I (High Sensitivity): 48 ng/L — ABNORMAL HIGH (ref <18)
Troponin I (High Sensitivity): 43 ng/L — ABNORMAL HIGH (ref <18)

## 2020-07-09 MED ORDER — ACETAMINOPHEN 325 MG PO TABS: 650.0000 mg | ORAL_TABLET | ORAL | Status: DC | PRN

## 2020-07-09 MED ORDER — METOPROLOL TARTRATE 25 MG PO TABS
25.0000 mg | ORAL_TABLET | Freq: Two times a day (BID) | ORAL | Status: DC
  Administered 2020-07-09: 25 mg via ORAL
  Filled 2020-07-09: qty 1

## 2020-07-09 MED ORDER — ONDANSETRON HCL 4 MG/2ML IJ SOLN: 4.0000 mg | Freq: Four times a day (QID) | INTRAMUSCULAR | Status: DC | PRN

## 2020-07-09 MED ORDER — ASPIRIN 81 MG PO CHEW: 81.0000 mg | CHEWABLE_TABLET | Freq: Every day | ORAL | Status: DC

## 2020-07-09 MED ORDER — SODIUM CHLORIDE 0.45 % IV SOLN: INTRAVENOUS | Status: DC

## 2020-07-09 MED ORDER — PANTOPRAZOLE SODIUM 40 MG PO TBEC: 40.0000 mg | DELAYED_RELEASE_TABLET | Freq: Every day | ORAL | Status: DC

## 2020-07-09 MED ORDER — TRAZODONE HCL 50 MG PO TABS
25.0000 mg | ORAL_TABLET | Freq: Every evening | ORAL | Status: DC | PRN
  Administered 2020-07-09: 50 mg via ORAL
  Filled 2020-07-09: qty 1

## 2020-07-09 MED ORDER — ASPIRIN 81 MG PO CHEW
324.0000 mg | CHEWABLE_TABLET | Freq: Once | ORAL | Status: AC
  Administered 2020-07-09: 324 mg via ORAL

## 2020-07-09 MED ORDER — HYDROCHLOROTHIAZIDE 25 MG PO TABS: 25.0000 mg | ORAL_TABLET | Freq: Every day | ORAL | Status: DC

## 2020-07-09 MED ORDER — ENOXAPARIN SODIUM 60 MG/0.6ML ~~LOC~~ SOLN
0.5000 mg/kg | SUBCUTANEOUS | Status: DC
  Administered 2020-07-09: 57.5 mg via SUBCUTANEOUS
  Filled 2020-07-09 (×2): qty 0.6

## 2020-07-09 MED ORDER — ALPRAZOLAM 0.25 MG PO TABS: 0.2500 mg | ORAL_TABLET | Freq: Three times a day (TID) | ORAL | Status: DC | PRN

## 2020-07-09 MED ORDER — ENOXAPARIN SODIUM 40 MG/0.4ML ~~LOC~~ SOLN: 40.0000 mg | SUBCUTANEOUS | Status: DC

## 2020-07-09 NOTE — ED Provider Notes (Signed)
[facility] Emergency Department Provider Note   ____________________________________________   Event Date/Time   First MD Initiated Contact with Patient 07/09/20 0915     (approximate)  I have reviewed the triage vital signs and the nursing notes.   HISTORY  Chief Complaint Panic Attack    HPI Kari Brandt is a 50 y.o. female with past medical history of hypertension, anxiety, and GERD who presents to the ED complaining of chest pain.  Patient reports that she became increasingly anxious at work with tightness in her chest along with numbness and tingling in her arms.  She denies any associated shortness of breath, had been feeling well prior to onset of symptoms with no fevers or cough.  She denies any pain or swelling in her legs.  By the time she has arrived to the ED, her chest tightness has resolved.  She describes symptoms today as similar to prior panic attacks.        Past Medical History:  Diagnosis Date  . Anxiety   . GERD (gastroesophageal reflux disease)   . Hypertension     Patient Active Problem List   Diagnosis Date Noted  . BMI 40.0-44.9, adult (HCC) 02/04/2020  . Vitamin D deficiency 02/04/2020  . Migraine 04/20/2019  . Hypertension   . Anxiety   . Insomnia   . GERD (gastroesophageal reflux disease)     Past Surgical History:  Procedure Laterality Date  . ENDOMETRIAL ABLATION  2007   her option  . TUBAL LIGATION    . WRIST SURGERY      Prior to Admission medications   Medication Sig Start Date End Date Taking? Authorizing Provider  Ascorbic Acid (VITAMIN C) 1000 MG tablet Take 1,000 mg by mouth daily.    [provider]  ASPIRIN 81 PO Take 1 tablet by mouth daily.    [provider]  hydrochlorothiazide (HYDRODIURIL) 25 MG tablet Take 1 tablet (25 mg total) by mouth daily. for high blood pressure 02/07/20   Caro Laroche, DO  pantoprazole (PROTONIX) 40 MG tablet Take 1 tablet (40 mg  total) by mouth daily as needed. 02/07/20   Caro Laroche, DO  traZODone (DESYREL) 50 MG tablet Take 0.5-1 tablets (25-50 mg total) by mouth at bedtime as needed for sleep. 05/02/19   Particia Nearing, PA-C  VITAMIN D PO Take by mouth.    [provider]    Allergies Patient has no known allergies.  Family History  Problem Relation Age of Onset  . Diabetes Mother   . Heart disease Mother   . Kidney disease Mother   . Anxiety disorder Sister   . Heart disease Maternal Grandmother   . Breast cancer Neg Hx     Social History Social History   Tobacco Use  . Smoking status: Never Smoker  . Smokeless tobacco: Never Used  Vaping Use  . Vaping Use: Never used  Substance Use Topics  . Alcohol use: Yes    Comment: occassionally  . Drug use: Never    Review of Systems  Constitutional: No fever/chills Eyes: No visual changes. ENT: No sore throat. Cardiovascular: Positive for chest pain. Respiratory: Denies shortness of breath. Gastrointestinal: No abdominal pain.  No nausea, no vomiting.  No diarrhea.  No constipation. Genitourinary: Negative for dysuria. Musculoskeletal: Negative for back pain. Skin: Negative for rash. Neurological: Negative for headaches or focal weakness.  Positive for numbness and tingling.  Bness.  ____________________________________________   PHYSICAL EXAM:  VITAL  SIGNS: ED Triage Vitals [07/09/20 0918]  Enc Vitals Group     BP (!) 173/87     Pulse Rate 97     Resp 20     Temp 98.3 F (36.8 C)     Temp Source Oral     SpO2 97 %     Weight 250 lb (113.4 kg)     Height 5\' 1"  (1.549 m)     Head Circumference      Peak Flow      Pain Score 0     Pain Loc      Pain Edu?      Excl. in GC?     Constitutional: Alert and oriented. Eyes: Conjunctivae are normal. Head: Atraumatic. Nose: No congestion/rhinnorhea. Mouth/Throat: Mucous membranes are moist. Neck: Normal ROM Cardiovascular: Normal rate, regular rhythm. Grossly  normal heart sounds.  2+ radial pulses bilaterally. Respiratory: Normal respiratory effort.  No retractions. Lungs CTAB. Gastrointestinal: Soft and nontender. No distention. Genitourinary: deferred Musculoskeletal: No lower extremity tenderness nor edema. Neurologic:  Normal speech and language. No gross focal neurologic deficits are appreciated. Skin:  Skin is warm, dry and intact. No rash noted. Psychiatric: Mood and affect are normal. Speech and behavior are normal.  ____________________________________________   LABS (all labs ordered are listed, but only abnormal results are displayed)  Labs Reviewed  BASIC METABOLIC PANEL - Abnormal; Notable for the following components:      Result Value   Glucose, Bld 116 (*)    All other components within normal limits  TROPONIN I (HIGH SENSITIVITY) - Abnormal; Notable for the following components:   Troponin I (High Sensitivity) 29 (*)    All other components within normal limits  TROPONIN I (HIGH SENSITIVITY) - Abnormal; Notable for the following components:   Troponin I (High Sensitivity) 48 (*)    All other components within normal limits  SARS CORONAVIRUS 2 (TAT 6-24 HRS)  CBC WITH DIFFERENTIAL/PLATELET   ____________________________________________  EKG  ED ECG REPORT I, , the attending physician, personally viewed and interpreted this ECG.   Date: 07/09/2020  EKG Time: 9:28  Rate: 98  Rhythm: normal sinus rhythm  Axis: Normal  Intervals:none  ST&T Change: Nonspecific T wave changes   PROCEDURES  Procedure(s) performed (including Critical Care):  Procedures   ____________________________________________   INITIAL IMPRESSION / ASSESSMENT AND PLAN / ED COURSE       50 year old female with past medical history of hypertension, GERD, and anxiety who presents to the ED with acute onset episode of tightness in her chest along with numbness and tingling.  She now states that symptoms are resolved and she  denies any cardiac history.  EKG does not show nonspecific T wave changes.  Labs reveal mildly elevated troponin, otherwise unremarkable with no chronic kidney disease to explain troponin elevation.  Troponin slightly uptrending on recheck, although patient remains asymptomatic.  Case discussed with Dr. 57 of cardiology, who recommends admission for observation and stress testing in the morning.  Plan discussed with hospitalist for admission.      ____________________________________________   FINAL CLINICAL IMPRESSION(S) / ED DIAGNOSES  Final diagnoses:  Chest pain, unspecified type  Elevated troponin     ED Discharge Orders    None       Note:  This document was prepared using Dragon voice recognition software and may include unintentional dictation errors.   Okey Dupre, MD 07/09/20 [phone]

## 2020-07-09 NOTE — H&P (Signed)
History and Physical    Kari Brandt XBD:[id number] DOB: Jan 11, 1971 DOA: 07/09/2020  PCP: [name], NP (Confirm with patient/family/NH records and if not entered, this has to be entered at [facility] point of entry) Patient coming from: home/work  I have personally briefly reviewed patient's old medical records in [facility]  Chief Complaint: panic attack, precordial chest pressure  HPI: Kari Brandt is a 50 y.o. female with medical history significant of  hypertension, anxiety, and GERD who presents to the ED complaining of chest pain.  Patient reports that she became increasingly anxious at work with tightness in her chest, described as dull and pressure like along with numbness and tingling in her arms.  She denies any associated shortness of breath, had been feeling well prior to onset of symptoms with no fevers or cough.  She denies any pain or swelling in her legs.  By the time she has arrived to the ED, her chest tightness has resolved.  She describes symptoms today as similar to prior panic attacks. Her husband was concerned and called EMS who transported patient to [facility]  ED Course: T 98.3  173/87  97  20  BMI 47. ED-P exam unremarkable. Bmet glu 116, CBCD nl, Trop #1 29, #2 48. EKG NSR, ST-T wave abnormal, no STEMI. EDP consulted cardiologist who recommended chest pain observation and stress testing. TR[facility] called to admit and manage evaluation.  Review of Systems: As per HPI otherwise 10 point review of systems negative.  S  Past Medical History:  Diagnosis Date  . Anxiety   . GERD (gastroesophageal reflux disease)   . Hypertension     Past Surgical History:  Procedure Laterality Date  . ENDOMETRIAL ABLATION  2007   her option  . TUBAL LIGATION    . WRIST SURGERY      Soc Hx - 1st marriage 9 years - divorced, 2nd marriage in 2nd year. She has 2 daughters and 1 son - ages 29,26,22. 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     reports that  she has never smoked. She has never used smokeless tobacco. She reports current alcohol use. She reports that she does not use drugs.  No Known Allergies  Family History  Problem Relation Age of Onset  . Diabetes Mother   . Heart disease Mother   . Kidney disease Mother   . Anxiety disorder Sister   . Heart disease Maternal Grandmother   . Breast cancer Neg Hx      Prior to Admission medications   Medication Sig Start Date End Date Taking? Authorizing Provider  Ascorbic Acid (VITAMIN C) 1000 MG tablet Take 1,000 mg by mouth daily.   Yes [provider]  ASPIRIN 81 PO Take 1 tablet by mouth daily.   Yes [provider]  hydrochlorothiazide (HYDRODIURIL) 25 MG tablet Take 1 tablet (25 mg total) by mouth daily. for high blood pressure 02/07/20  Yes Caro Laroche, DO  pantoprazole (PROTONIX) 40 MG tablet Take 1 tablet (40 mg total) by mouth daily as needed. 02/07/20  Yes Caro Laroche, DO  traZODone (DESYREL) 50 MG tablet Take 0.5-1 tablets (25-50 mg total) by mouth at bedtime as needed for sleep. 05/02/19  Yes Particia Nearing, PA-C  VITAMIN D PO Take by mouth.   Yes [provider]    Physical Exam: Vitals:   07/09/20 0918  BP: (!) 173/87  Pulse: 97  Resp: 20  Temp: 98.3 F (36.8  C)  TempSrc: Oral  SpO2: 97%  Weight: 113.4 kg  Height: 5\' 1"  (1.549 m)     Vitals:   07/09/20 0918  BP: (!) 173/87  Pulse: 97  Resp: 20  Temp: 98.3 F (36.8 C)  TempSrc: Oral  SpO2: 97%  Weight: 113.4 kg  Height: 5\' 1"  (1.549 m)   General: very pleasant obese woman in no distress Eyes: PERRL, lids and conjunctivae normal ENMT: Mucous membranes are moist. Posterior pharynx clear of any exudate or lesions.Normal dentition.  Neck: normal, supple, no masses, no thyromegaly Respiratory: clear to auscultation bilaterally, no wheezing, no crackles. Normal respiratory effort. No accessory muscle use.  Cardiovascular: Regular rate and rhythm, no murmurs /  rubs / gallops. No extremity edema. 2+ pedal pulses. No carotid bruits.  Abdomen: obese, no tenderness, no masses palpated. No hepatosplenomegaly. Bowel sounds positive.  Musculoskeletal: no clubbing / cyanosis. No joint deformity upper and lower extremities. Good ROM, no contractures. Normal muscle tone.  Skin: no rashes, lesions, ulcers. No induration Neurologic: CN 2-12 grossly intact. Sensation intact, DTR normal. Strength 5/5 in all 4.  Psychiatric: Normal judgment and insight. Alert and oriented x 3. Normal mood.     Labs on Admission: I have personally reviewed following labs and imaging studies  CBC: Recent Labs  Lab 07/09/20 1004  WBC 8.4  NEUTROABS 5.2  HGB 13.6  HCT 40.4  MCV 86.1  PLT 355   Basic Metabolic Panel: Recent Labs  Lab 07/09/20 1004  NA 140  K 3.7  CL 107  CO2 25  GLUCOSE 116*  BUN 13  CREATININE 0.67  CALCIUM 9.2   GFR: Estimated Creatinine Clearance: 101.6 mL/min (by C-G formula based on SCr of 0.67 mg/dL). Liver Function Tests: No results for input(s): AST, ALT, ALKPHOS, BILITOT, PROT, ALBUMIN in the last 168 hours. No results for input(s): LIPASE, AMYLASE in the last 168 hours. No results for input(s): AMMONIA in the last 168 hours. Coagulation Profile: No results for input(s): INR, PROTIME in the last 168 hours. Cardiac Enzymes: No results for input(s): CKTOTAL, CKMB, CKMBINDEX, TROPONINI in the last 168 hours. BNP (last 3 results) No results for input(s): PROBNP in the last 8760 hours. HbA1C: No results for input(s): HGBA1C in the last 72 hours. CBG: No results for input(s): GLUCAP in the last 168 hours. Lipid Profile: No results for input(s): CHOL, HDL, LDLCALC, TRIG, CHOLHDL, LDLDIRECT in the last 72 hours. Thyroid Function Tests: No results for input(s): TSH, T4TOTAL, FREET4, T3FREE, THYROIDAB in the last 72 hours. Anemia Panel: No results for input(s): VITAMINB12, FOLATE, FERRITIN, TIBC, IRON, RETICCTPCT in the last 72  hours. Urine analysis:    Component Value Date/Time   APPEARANCEUR Clear 06/29/2019 1043   GLUCOSEU Negative 06/29/2019 1043   BILIRUBINUR Negative 06/29/2019 1043   PROTEINUR Negative 06/29/2019 1043   NITRITE Negative 06/29/2019 1043   LEUKOCYTESUR Negative 06/29/2019 1043    Radiological Exams on Admission: No results found.  EKG: Independently reviewed. NSR, ST-T wave abnl, no STEMI  Assessment/Plan Active Problems:   Hypertension   Chest pain   GERD (gastroesophageal reflux disease)   Anxiety   BMI 40.0-44.9, adult (HCC)    1. Chest pain - HEART 7: atypical chest pressure, mildly abnl EKG with ST-T wave abnl, 59 y/o, mother had CAD with first symptoms in her 61's and came to CABG, obesity, uncontrolled HTN, mild glucose elevation with Troponins mildly elevated but trending up. Plan Admit for observation to tele bed  Second set of Troponins  Add BB bid  Lexican stress test  Risk factor modification - check lipid panel in AM  2. HTN - on low dose HCTZ. Review of last office visits reveals suboptimal control. Plan Continue HCTZ  Add BB for now and have PCP work with patient on long term regimen  3. BMI 47 - obesity as major risk factor for morbidity. Discussed weight loss with patient PlAN Life style changes: smart food choices, portion size control. Target weight loss of 1-2 lbs per month. Exercise - walk starting at 15 minutes per day working up to 30 minutes  4. Anxiety - during hospital stay Alprazolam 0.25 TID prn    DVT prophylaxis: lovenox  Code Status: full code  Family Communication: Spoke with Kari Brandt, spouse, explaining lab results and Dx plan  Disposition Plan: home 24-48 hrs  Consults called: none - EDP spoke with Card on call, will see if needed  Admission status: obs-tele    Illene Regulus MD [facility] Pager [phone]  If 7PM-7AM, please contact night-coverage [email or web address] Password [facility]  07/09/2020, 2:58 PM

## 2020-07-09 NOTE — Progress Notes (Signed)
PHARMACIST - PHYSICIAN COMMUNICATION  CONCERNING:  Enoxaparin (Lovenox) for DVT Prophylaxis    RECOMMENDATION: Patient was prescribed enoxaprin 40mg  q24 hours for VTE prophylaxis.   Filed Weights   07/09/20 0918  Weight: 113.4 kg (250 lb)    Body mass index is 47.24 kg/m.  Estimated Creatinine Clearance: 101.6 mL/min (by C-G formula based on SCr of 0.67 mg/dL).   Based on [facility] policy patient is candidate for enoxaparin 0.5mg /kg TBW SQ every 24 hours based on BMI being >30.  DESCRIPTION: Pharmacy has adjusted enoxaparin dose per [facility] policy.  Patient is now receiving enoxaparin 57.5 mg every 24 hours    [facility], PharmD Clinical Pharmacist  07/09/2020 2:34 PM

## 2020-07-09 NOTE — Telephone Encounter (Signed)
Copied from CRM [id number]. Topic: General - Other >> Jul 09, 2020  8:42 AM Jaquita Rector A wrote: Reason for CRM: Patient husband Orvilla Fus called in to inform her PCP that she is on the way to the hospital after suffering a panic attack and her BP was 192/90 she is being transported by ambulance. Any questions please call Tommy at Ph# [phone]

## 2020-07-09 NOTE — Telephone Encounter (Signed)
Thank you for the update!

## 2020-07-09 NOTE — ED Triage Notes (Signed)
Pt presents via acems with c/o panic attack while at work. Pt states she became anxious and felt chest tightness while at work. Pt reports symptoms have since then resolved. Pt alert and oriented x4, pt denies chest pain or shortness of breath at this time.

## 2020-07-10 ENCOUNTER — Other Ambulatory Visit: Payer: Self-pay

## 2020-07-10 ENCOUNTER — Encounter: Payer: Self-pay | Admitting: Internal Medicine

## 2020-07-10 ENCOUNTER — Telehealth: Payer: Self-pay

## 2020-07-10 ENCOUNTER — Observation Stay (HOSPITAL_BASED_OUTPATIENT_CLINIC_OR_DEPARTMENT_OTHER)
Admit: 2020-07-10 | Discharge: 2020-07-10 | Disposition: A | Discharge status: Home / Self Care | Payer: BC Managed Care – PPO | Attending: Physician Assistant | Admitting: Physician Assistant

## 2020-07-10 ENCOUNTER — Observation Stay (HOSPITAL_BASED_OUTPATIENT_CLINIC_OR_DEPARTMENT_OTHER): Payer: BC Managed Care – PPO

## 2020-07-10 DIAGNOSIS — I10 Essential (primary) hypertension: Secondary | ICD-10-CM | POA: Diagnosis not present

## 2020-07-10 DIAGNOSIS — Z6841 Body Mass Index (BMI) 40.0 and over, adult: Secondary | ICD-10-CM

## 2020-07-10 DIAGNOSIS — R778 Other specified abnormalities of plasma proteins: Secondary | ICD-10-CM

## 2020-07-10 DIAGNOSIS — R079 Chest pain, unspecified: Secondary | ICD-10-CM

## 2020-07-10 DIAGNOSIS — R7989 Other specified abnormal findings of blood chemistry: Secondary | ICD-10-CM

## 2020-07-10 DIAGNOSIS — F419 Anxiety disorder, unspecified: Secondary | ICD-10-CM

## 2020-07-10 LAB — TROPONIN I (HIGH SENSITIVITY): Troponin I (High Sensitivity): 15 ng/L (ref <18)

## 2020-07-10 LAB — ECHOCARDIOGRAM COMPLETE
AR max vel: 2.6 cm2
AV Area VTI: 3 cm2
AV Area mean vel: 2.52 cm2
AV Mean grad: 3 mmHg
AV Peak grad: 4.7 mmHg
Ao pk vel: 1.08 m/s
Area-P 1/2: 3.31 cm2
Height: 61 in
S' Lateral: 2.77 cm
Weight: 3656 oz

## 2020-07-10 LAB — HIV ANTIBODY (ROUTINE TESTING W REFLEX): HIV Screen 4th Generation wRfx: NONREACTIVE

## 2020-07-10 LAB — NM MYOCAR MULTI W/SPECT W/WALL MOTION / EF
LV dias vol: 89 mL (ref 46–106)
LV sys vol: 31 mL
Peak HR: 112 {beats}/min
Percent HR: 64 %
Rest HR: 69 {beats}/min
SDS: 1
SRS: 0
SSS: 1
TID: 1.16

## 2020-07-10 LAB — LIPID PANEL
Cholesterol: 200 mg/dL (ref 0–200)
HDL: 34 mg/dL — ABNORMAL LOW (ref >40)
LDL Cholesterol: 117 mg/dL — ABNORMAL HIGH (ref 0–99)
Total CHOL/HDL Ratio: 5.9 RATIO
Triglycerides: 244 mg/dL — ABNORMAL HIGH (ref <150)
VLDL: 49 mg/dL — ABNORMAL HIGH (ref 0–40)

## 2020-07-10 LAB — SARS CORONAVIRUS 2 (TAT 6-24 HRS): SARS Coronavirus 2: NEGATIVE

## 2020-07-10 MED ORDER — REGADENOSON 0.4 MG/5ML IV SOLN
0.4000 mg | Freq: Once | INTRAVENOUS | Status: AC
  Administered 2020-07-10: 0.4 mg via INTRAVENOUS

## 2020-07-10 MED ORDER — ALPRAZOLAM 0.25 MG PO TABS: 0.2500 mg | ORAL_TABLET | Freq: Two times a day (BID) | ORAL | 0 refills | Status: AC | PRN

## 2020-07-10 MED ORDER — TECHNETIUM TC 99M TETROFOSMIN IV KIT
30.0000 | PACK | Freq: Once | INTRAVENOUS | Status: AC | PRN
  Administered 2020-07-10: 30.407 via INTRAVENOUS

## 2020-07-10 MED ORDER — TECHNETIUM TC 99M TETROFOSMIN IV KIT
10.0000 | PACK | Freq: Once | INTRAVENOUS | Status: AC | PRN
  Administered 2020-07-10: 9.894 via INTRAVENOUS

## 2020-07-10 NOTE — Discharge Summary (Signed)
Physician Discharge Summary  Kari Brandt EGB:[id number] DOB: 10-01-1970 DOA: 07/09/2020  PCP: [name], NP  Admit date: 07/09/2020 Discharge date: 07/10/2020  Admitted From: Home Disposition: Home  Recommendations for Outpatient Follow-up:  1. Follow up with PCP in 1-2 weeks   Home Health: No Equipment/Devices: None Discharge Condition: Stable CODE STATUS: Full Diet recommendation: Heart Healthy  Brief/Interim Summary: Kari Brandt is a 50 y.o. female with medical history significant of hypertension, anxiety, and GERD who presents to the ED complaining of chest pain. Patient reports that she became increasingly anxious at work with tightness in her chest, described as dull and pressure like along with numbness and tingling in her arms. She denies any associated shortness of breath, had been feeling well prior to onset of symptoms with no fevers or cough. She denies any pain or swelling in her legs. By the time she has arrived to the ED, her chest tightness has resolved. She describes symptoms today as similar to prior panic attacks. Her husband was concerned and called EMS who transported patient to [facility]  Upon arrival no indication of ACS.  Cardiology consulted by EDP who recommended admission for observation and a.m. stress testing.  Patient underwent TTE and nuclear stress test on 4/20.  Results of both are reassuring.  Case discussed with cardiology.  No outpatient cardiology follow-up recommended at this time.  Per my discussion with the patient there were no clear triggering factors to her events.  She states they occasionally wake her up from sleep.  I strongly encouraged her to follow-up with her primary care physician and discuss some of these concerns.  If she is determined to be appropriate a trial of buspirone may be warranted for generalized anxiety disorder.  At time of discharge will prescribe low-dose as needed Xanax 0.25 mg twice daily to address  any potential anxiety attacks post discharge.   Discharge Diagnoses:  Active Problems:   GERD (gastroesophageal reflux disease)   Hypertension   Anxiety   BMI 40.0-44.9, adult (HCC)   Chest pain   Elevated troponin  1. Chest pain - HEART 7: atypical chest pressure, mildly abnl EKG with ST-T wave abnl, 57 y/o, mother had CAD with first symptoms in her 36's and came to CABG, obesity, uncontrolled HTN, mild glucose elevation with Troponins mildly elevated.  No indication of ACS.  Patient underwent echocardiogram and nuclear stress test.  Both were within normal limits per cardiology evaluation.  Patient stable for discharge home at this time.  No cardiology follow-up recommended.  Patient recommended to follow-up with her PCP within a week of discharge.  2. HTN - on low dose HCTZ.  We will not add new medications at this time as hypertension likely augmented by a high anxiety state.  Follow-up outpatient PCP to discuss optimal control of blood pressure.   3. BMI 47 - obesity as major risk factor for morbidity. Discussed weight loss with patient  4. Anxiety -as needed low-dose Xanax 25 twice daily x5 days prescribed at time of discharge.  Follow-up outpatient PCP.  If patient deemed to meet criteria for generalized anxiety disorder she may be a good candidate for trial of buspirone.  Discharge Instructions  Discharge Instructions    Diet - low sodium heart healthy   Complete by: As directed    Increase activity slowly   Complete by: As directed      Allergies as of 07/10/2020   No Known Allergies     Medication List  TAKE these medications   ALPRAZolam 0.25 MG tablet Commonly known as: XANAX Take 1 tablet (0.25 mg total) by mouth 2 (two) times daily as needed for up to 5 days for anxiety.   ASPIRIN 81 PO Take 1 tablet by mouth daily.   hydrochlorothiazide 25 MG tablet Commonly known as: HYDRODIURIL Take 1 tablet (25 mg total) by mouth daily. for high blood pressure    pantoprazole 40 MG tablet Commonly known as: PROTONIX Take 1 tablet (40 mg total) by mouth daily as needed.   traZODone 50 MG tablet Commonly known as: DESYREL Take 0.5-1 tablets (25-50 mg total) by mouth at bedtime as needed for sleep.   vitamin C 1000 MG tablet Take 1,000 mg by mouth daily.   VITAMIN D PO Take by mouth.       Follow-up Information    [name], NP. Schedule an appointment as soon as possible for a visit in 1 week(s).   Specialty: Nurse Practitioner Contact information: [address] [place] [address] [phone]              No Known Allergies  Consultations:  Cardiology-[facility]   Procedures/Studies: NM Myocar Multi W/Spect W/Wall Motion / EF  Result Date: 07/10/2020  T wave inversion was noted during stress in the II, III, aVF, V3, V4, V5 and V6 leads, beginning at 1 minutes of stress.  There was no ST segment deviation noted during stress.  The study is normal.  This is a low risk study.  The left ventricular ejection fraction is hyperdynamic (77%).  There is no evidence for ischemia    ECHOCARDIOGRAM COMPLETE  Result Date: 07/10/2020    ECHOCARDIOGRAM REPORT   Patient Name:   [facility] Date of Exam: 07/10/2020 Medical Rec #:  [id number]            Height:       61.0 in Accession #:    [id number]           Weight:       228.5 lb Date of Birth:  [birth year]            BSA:          1.999 m Patient Age:    49 years             BP:           140/76 mmHg Patient Gender: F                    HR:           73 bpm. Exam Location:  [facility] Procedure: 2D Echo, Cardiac Doppler and Color Doppler Indications:     Chest pain R07.9  History:         Patient has no prior history of Echocardiogram examinations.                  Risk Factors:Hypertension.  Sonographer:     Cristela Blue RDCS (AE) Referring Phys:  [id number] Sondra Barges Diagnosing Phys: Debbe Odea MD  Sonographer Comments: Suboptimal apical window. IMPRESSIONS  1. Left  ventricular ejection fraction, by estimation, is 60 to 65%. The left ventricle has normal function. The left ventricle has no regional wall motion abnormalities. Left ventricular diastolic parameters were normal.  2. Right ventricular systolic function is normal. The right ventricular size is normal.  3. The mitral valve is normal in structure. No evidence of mitral valve regurgitation.  4. The aortic  valve was not well visualized. Aortic valve regurgitation is not visualized. FINDINGS  Left Ventricle: Left ventricular ejection fraction, by estimation, is 60 to 65%. The left ventricle has normal function. The left ventricle has no regional wall motion abnormalities. The left ventricular internal cavity size was normal in size. There is  no left ventricular hypertrophy. Left ventricular diastolic parameters were normal. Right Ventricle: The right ventricular size is normal. No increase in right ventricular wall thickness. Right ventricular systolic function is normal. Left Atrium: Left atrial size was normal in size. Right Atrium: Right atrial size was normal in size. Pericardium: There is no evidence of pericardial effusion. Mitral Valve: The mitral valve is normal in structure. No evidence of mitral valve regurgitation. Tricuspid Valve: The tricuspid valve is grossly normal. Tricuspid valve regurgitation is not demonstrated. Aortic Valve: The aortic valve was not well visualized. Aortic valve regurgitation is not visualized. Aortic valve mean gradient measures 3.0 mmHg. Aortic valve peak gradient measures 4.7 mmHg. Aortic valve area, by VTI measures 3.00 cm. Pulmonic Valve: The pulmonic valve was normal in structure. Pulmonic valve regurgitation is not visualized. Aorta: The aortic root is normal in size and structure. Venous: The inferior vena cava was not well visualized. IAS/Shunts: No atrial level shunt detected by color flow Doppler.  LEFT VENTRICLE PLAX 2D LVIDd:         3.80 cm  Diastology LVIDs:          2.77 cm  LV e' medial:    5.66 cm/s LV PW:         1.54 cm  LV E/e' medial:  12.7 LV IVS:        1.08 cm  LV e' lateral:   5.77 cm/s LVOT diam:     2.00 cm  LV E/e' lateral: 12.5 LV SV:         66 LV SV Index:   33 LVOT Area:     3.14 cm  RIGHT VENTRICLE RV Basal diam:  3.15 cm RV S prime:     14.80 cm/s TAPSE (M-mode): 3.5 cm LEFT ATRIUM             Index       RIGHT ATRIUM           Index LA diam:        4.10 cm 2.05 cm/m  RA Area:     18.60 cm LA Vol (A2C):   37.5 ml 18.76 ml/m RA Volume:   57.00 ml  28.52 ml/m LA Vol (A4C):   56.5 ml 28.27 ml/m LA Biplane Vol: 50.0 ml 25.02 ml/m  AORTIC VALVE                   PULMONIC VALVE AV Area (Vmax):    2.60 cm    PV Vmax:        0.90 m/s AV Area (Vmean):   2.52 cm    PV Peak grad:   3.2 mmHg AV Area (VTI):     3.00 cm    RVOT Peak grad: 4 mmHg AV Vmax:           108.00 cm/s AV Vmean:          77.300 cm/s AV VTI:            0.220 m AV Peak Grad:      4.7 mmHg AV Mean Grad:      3.0 mmHg LVOT Vmax:         89.40 cm/s LVOT  Vmean:        62.100 cm/s LVOT VTI:          0.210 m LVOT/AV VTI ratio: 0.95  AORTA Ao Root diam: 3.33 cm MITRAL VALVE               TRICUSPID VALVE MV Area (PHT): 3.31 cm    TR Peak grad:   14.6 mmHg MV Decel Time: 229 msec    TR Vmax:        191.00 cm/s MV E velocity: 72.00 cm/s MV A velocity: 69.40 cm/s  SHUNTS MV E/A ratio:  1.04        Systemic VTI:  0.21 m                            Systemic Diam: 2.00 cm Debbe Odea MD Electronically signed by Debbe Odea MD Signature Date/Time: 07/10/2020/2:07:32 PM    Final     (Echo, Carotid, EGD, Colonoscopy, ERCP)    Subjective: Seen and examined on the day of discharge.  Stable, no distress.  Chest pain resolved.  Stable for discharge home.  Discharge Exam: Vitals:   07/10/20 0431 07/10/20 0756  BP: 131/76 140/76  Pulse: 72 73  Resp: 19 18  Temp: 98.3 F (36.8 C) 98.3 F (36.8 C)  SpO2: 98% 98%   Vitals:   07/10/20 0331 07/10/20 0333 07/10/20 0431 07/10/20 0756  BP:  115/60  131/76 140/76  Pulse: 70  72 73  Resp: 14  19 18   Temp: 98.2 F (36.8 C)  98.3 F (36.8 C) 98.3 F (36.8 C)  TempSrc: Oral  Oral   SpO2: 96% 96% 98% 98%  Weight:   103.6 kg   Height:        General: Pt is alert, awake, not in acute distress Cardiovascular: RRR, S1/S2 +, no rubs, no gallops Respiratory: CTA bilaterally, no wheezing, no rhonchi Abdominal: Soft, NT, ND, bowel sounds + Extremities: no edema, no cyanosis    The results of significant diagnostics from this hospitalization (including imaging, microbiology, ancillary and laboratory) are listed below for reference.     Microbiology: Recent Results (from the past 240 hour(s))  SARS CORONAVIRUS 2 (TAT 6-24 HRS) Nasopharyngeal Nasopharyngeal Swab     Status: None   Collection Time: 07/09/20  1:28 PM   Specimen: Nasopharyngeal Swab  Result Value Ref Range Status   SARS Coronavirus 2 NEGATIVE NEGATIVE Final    Comment: (NOTE) SARS-CoV-2 target nucleic acids are NOT DETECTED.  The SARS-CoV-2 RNA is generally detectable in upper and lower respiratory specimens during the acute phase of infection. Negative results do not preclude SARS-CoV-2 infection, do not rule out co-infections with other pathogens, and should not be used as the sole basis for treatment or other patient management decisions. Negative results must be combined with clinical observations, patient history, and epidemiological information. The expected result is Negative.  Fact Sheet for Patients: 07/11/20  Fact Sheet for Healthcare Providers: HairSlick.no  This test is not yet approved or cleared by the [place] FDA and  has been authorized for detection and/or diagnosis of SARS-CoV-2 by FDA under an Emergency Use Authorization (EUA). This EUA will remain  in effect (meaning this test can be used) for the duration of the COVID-19 declaration under Se ction 564(b)(1) of the  Act, 21 U.S.C. section 360bbb-3(b)(1), unless the authorization is terminated or revoked sooner.  Performed at [facility], [address], [place], [place]  [address]      Labs: BNP (last 3 results) No results for input(s): BNP in the last 8760 hours. Basic Metabolic Panel: Recent Labs  Lab 07/09/20 1004  NA 140  K 3.7  CL 107  CO2 25  GLUCOSE 116*  BUN 13  CREATININE 0.67  CALCIUM 9.2   Liver Function Tests: No results for input(s): AST, ALT, ALKPHOS, BILITOT, PROT, ALBUMIN in the last 168 hours. No results for input(s): LIPASE, AMYLASE in the last 168 hours. No results for input(s): AMMONIA in the last 168 hours. CBC: Recent Labs  Lab 07/09/20 1004  WBC 8.4  NEUTROABS 5.2  HGB 13.6  HCT 40.4  MCV 86.1  PLT 355   Cardiac Enzymes: No results for input(s): CKTOTAL, CKMB, CKMBINDEX, TROPONINI in the last 168 hours. BNP: Invalid input(s): POCBNP CBG: No results for input(s): GLUCAP in the last 168 hours. D-Dimer No results for input(s): DDIMER in the last 72 hours. Hgb A1c No results for input(s): HGBA1C in the last 72 hours. Lipid Profile Recent Labs    07/10/20 0447  CHOL 200  HDL 34*  LDLCALC 117*  TRIG 244*  CHOLHDL 5.9   Thyroid function studies No results for input(s): TSH, T4TOTAL, T3FREE, THYROIDAB in the last 72 hours.  Invalid input(s): FREET3 Anemia work up No results for input(s): VITAMINB12, FOLATE, FERRITIN, TIBC, IRON, RETICCTPCT in the last 72 hours. Urinalysis    Component Value Date/Time   APPEARANCEUR Clear 06/29/2019 1043   GLUCOSEU Negative 06/29/2019 1043   BILIRUBINUR Negative 06/29/2019 1043   PROTEINUR Negative 06/29/2019 1043   NITRITE Negative 06/29/2019 1043   LEUKOCYTESUR Negative 06/29/2019 1043   Sepsis Labs Invalid input(s): PROCALCITONIN,  WBC,  LACTICIDVEN Microbiology Recent Results (from the past 240 hour(s))  SARS CORONAVIRUS 2 (TAT 6-24 HRS) Nasopharyngeal Nasopharyngeal Swab     Status: None    Collection Time: 07/09/20  1:28 PM   Specimen: Nasopharyngeal Swab  Result Value Ref Range Status   SARS Coronavirus 2 NEGATIVE NEGATIVE Final    Comment: (NOTE) SARS-CoV-2 target nucleic acids are NOT DETECTED.  The SARS-CoV-2 RNA is generally detectable in upper and lower respiratory specimens during the acute phase of infection. Negative results do not preclude SARS-CoV-2 infection, do not rule out co-infections with other pathogens, and should not be used as the sole basis for treatment or other patient management decisions. Negative results must be combined with clinical observations, patient history, and epidemiological information. The expected result is Negative.  Fact Sheet for Patients: HairSlick.no  Fact Sheet for Healthcare Providers: quierodirigir.com  This test is not yet approved or cleared by the [place] FDA and  has been authorized for detection and/or diagnosis of SARS-CoV-2 by FDA under an Emergency Use Authorization (EUA). This EUA will remain  in effect (meaning this test can be used) for the duration of the COVID-19 declaration under Se ction 564(b)(1) of the Act, 21 U.S.C. section 360bbb-3(b)(1), unless the authorization is terminated or revoked sooner.  Performed at [facility], [address] [place], [place] [address]      Time coordinating discharge: Over 30 minutes  SIGNED:   Tresa Moore, MD  [facility] 07/10/2020, 2:36 PM Pager   If 7PM-7AM, please contact night-coverage

## 2020-07-10 NOTE — ED Notes (Signed)
Pt up to restroom without difficulty- ambulated independently. 1x void

## 2020-07-10 NOTE — Telephone Encounter (Signed)
Patient notified

## 2020-07-10 NOTE — Progress Notes (Signed)
*  PRELIMINARY RESULTS* Echocardiogram 2D Echocardiogram has been performed.  Kari Brandt 07/10/2020, 1:26 PM

## 2020-07-10 NOTE — Plan of Care (Signed)

## 2020-07-10 NOTE — Progress Notes (Signed)
Patient discharged per orders. PIV/tele removed from patient. AVS reviewed; expressed understanding. Taken down to medical mall via wheelchair by volunteer.

## 2020-07-10 NOTE — Telephone Encounter (Signed)
Routing to provider  

## 2020-07-10 NOTE — Consult Note (Signed)
Cardiology Consultation:   Patient ID: Kari Brandt; [id number]; [birth year]   Admit date: 07/09/2020 Date of Consult: 07/10/2020  Primary Care Provider: [name], NP Primary Cardiologist: New to [facility] - consult by [facility]ang Primary Electrophysiologist:  None   Patient Profile:   Kari Brandt is a 50 y.o. female with a hx of HTN, anxiety with panic attack disorder, family history of premature CAD in her mother, and obesity who is being seen today for the evaluation of chest pain at the request of Dr. Debby Bud.  History of Present Illness:   Kari Brandt has no previously known cardiac history. He has history of anxiety with panic attack disorder without known triggers. She was in her usual state of health on 4/19, at work, when she had a panic attack. She is uncertain what led to this attack. In this setting, she developed some chest tightness that was not radiating. It was associated with SOB, palpitations, diaphoresis, and nausea. Symptoms lasted 15 minutes and resolved without intervention. She called her husband, who works for the same company, and they went to the office to check her BP. It was noted to be elevated, therefore EMS was called. Symptoms had resolved by the time EMS arrive and she has been asymptomatic since. She reports never being a smoker of tobacco, does not use illicit substances, and rarely drinks EtOH. She does report her mother had a 3-vessel CABG at age 30.   Upon the patient's arrival to [facility] they were found to be hypertensive in the 170s mmHg systolic. EKG showed sinus rhythm with nonspecific lateral st/t changes. HS-Tn 29 with a delta and peak troponin of 48, subsequently down trending and now normal. In the ED, she received ASA 324 mg in the ED. She has been chest pain free since.   Past Medical History:  Diagnosis Date  . Anxiety   . GERD (gastroesophageal reflux disease)   . Hypertension     Past Surgical History:  Procedure  Laterality Date  . ENDOMETRIAL ABLATION  2007   her option  . TUBAL LIGATION    . WRIST SURGERY       Home Meds: Prior to Admission medications   Medication Sig Start Date End Date Taking? Authorizing Provider  Ascorbic Acid (VITAMIN C) 1000 MG tablet Take 1,000 mg by mouth daily.   Yes [provider]  ASPIRIN 81 PO Take 1 tablet by mouth daily.   Yes [provider]  hydrochlorothiazide (HYDRODIURIL) 25 MG tablet Take 1 tablet (25 mg total) by mouth daily. for high blood pressure 02/07/20  Yes Caro Laroche, DO  pantoprazole (PROTONIX) 40 MG tablet Take 1 tablet (40 mg total) by mouth daily as needed. 02/07/20  Yes Caro Laroche, DO  traZODone (DESYREL) 50 MG tablet Take 0.5-1 tablets (25-50 mg total) by mouth at bedtime as needed for sleep. 05/02/19  Yes Particia Nearing, PA-C  VITAMIN D PO Take by mouth.   Yes [provider]    Inpatient Medications: Scheduled Meds: . aspirin  81 mg Oral Daily  . enoxaparin (LOVENOX) injection  0.5 mg/kg Subcutaneous Q24H  . hydrochlorothiazide  25 mg Oral Daily  . metoprolol tartrate  25 mg Oral BID  . pantoprazole  40 mg Oral Daily   Continuous Infusions: . sodium chloride 50 mL/hr at 07/10/20 0451   PRN Meds: acetaminophen, ALPRAZolam, ondansetron (ZOFRAN) IV, traZODone  Allergies:  No Known Allergies  Social History:   Social History  Socioeconomic History  . Marital status: Married    Spouse name: Not on file  . Number of children: Not on file  . Years of education: Not on file  . Highest education level: Not on file  Occupational History  . Not on file  Tobacco Use  . Smoking status: Never Smoker  . Smokeless tobacco: Never Used  Vaping Use  . Vaping Use: Never used  Substance and Sexual Activity  . Alcohol use: Yes    Comment: occassionally  . Drug use: Never  . Sexual activity: Yes    Birth control/protection: None, Surgical    Comment: Tubal Ligation  Other Topics Concern   . Not on file  Social History Narrative  . Not on file   Social Determinants of Health   Financial Resource Strain: Not on file  Food Insecurity: Not on file  Transportation Needs: Not on file  Physical Activity: Not on file  Stress: Not on file  Social Connections: Not on file  Intimate Partner Violence: Not on file     Family History:   Family History  Problem Relation Age of Onset  . Diabetes Mother   . Heart disease Mother   . Kidney disease Mother   . Anxiety disorder Sister   . Heart disease Maternal Grandmother   . Breast cancer Neg Hx     ROS:  Review of Systems  Constitutional: Positive for diaphoresis. Negative for chills, fever, malaise/fatigue and weight loss.  HENT: Negative for congestion.   Eyes: Negative for discharge and redness.  Respiratory: Positive for shortness of breath. Negative for cough, sputum production and wheezing.   Cardiovascular: Positive for chest pain and palpitations. Negative for orthopnea, claudication, leg swelling and PND.  Gastrointestinal: Positive for nausea. Negative for abdominal pain, heartburn and vomiting.  Musculoskeletal: Negative for falls and myalgias.  Skin: Negative for rash.  Neurological: Negative for dizziness, tingling, tremors, sensory change, speech change, focal weakness, loss of consciousness and weakness.  Endo/Heme/Allergies: Does not bruise/bleed easily.  Psychiatric/Behavioral: Negative for substance abuse. The patient is nervous/anxious.   All other systems reviewed and are negative.     Physical Exam/Data:   Vitals:   07/10/20 0331 07/10/20 0333 07/10/20 0431 07/10/20 0756  BP: 115/60  131/76 140/76  Pulse: 70  72 73  Resp: 14  19 18   Temp: 98.2 F (36.8 C)  98.3 F (36.8 C) 98.3 F (36.8 C)  TempSrc: Oral  Oral   SpO2: 96% 96% 98% 98%  Weight:   103.6 kg   Height:        Intake/Output Summary (Last 24 hours) at 07/10/2020 0849 Last data filed at 07/10/2020 0756 Gross per 24 hour  Intake  --  Output 0 ml  Net 0 ml   Filed Weights   07/09/20 0918 07/10/20 0431  Weight: 113.4 kg 103.6 kg   Body mass index is 43.17 kg/m.   Physical Exam: General: Well developed, well nourished, in no acute distress. Head: Normocephalic, atraumatic, sclera non-icteric, no xanthomas, nares without discharge.  Neck: Negative for carotid bruits. JVD not elevated. Lungs: Clear bilaterally to auscultation without wheezes, rales, or rhonchi. Breathing is unlabored. Heart: RRR with S1 S2. No murmurs, rubs, or gallops appreciated. Abdomen: Soft, non-tender, non-distended with normoactive bowel sounds. No hepatomegaly. No rebound/guarding. No obvious abdominal masses. Msk:  Strength and tone appear normal for age. Extremities: No clubbing or cyanosis. No edema. Distal pedal pulses are 2+ and equal bilaterally. Neuro: Alert and oriented X 3. No  facial asymmetry. No focal deficit. Moves all extremities spontaneously. Psych:  Responds to questions appropriately with a normal affect.   EKG:  The EKG was personally reviewed and demonstrates: NSR, 98 bpm, nonspecific lateral st/t changes Telemetry:  Telemetry was personally reviewed and demonstrates: SR with rare PVC  Weights: Filed Weights   07/09/20 0918 07/10/20 0431  Weight: 113.4 kg 103.6 kg    Relevant CV Studies:  None available for review  Laboratory Data:  Chemistry Recent Labs  Lab 07/09/20 1004  NA 140  K 3.7  CL 107  CO2 25  GLUCOSE 116*  BUN 13  CREATININE 0.67  CALCIUM 9.2  GFRNONAA >60  ANIONGAP 8    No results for input(s): PROT, ALBUMIN, AST, ALT, ALKPHOS, BILITOT in the last 168 hours. Hematology Recent Labs  Lab 07/09/20 1004  WBC 8.4  RBC 4.69  HGB 13.6  HCT 40.4  MCV 86.1  MCH 29.0  MCHC 33.7  RDW 13.6  PLT 355   Cardiac EnzymesNo results for input(s): TROPONINI in the last 168 hours. No results for input(s): TROPIPOC in the last 168 hours.  BNPNo results for input(s): BNP, PROBNP in the last 168  hours.  DDimer No results for input(s): DDIMER in the last 168 hours.  Radiology/Studies:  No results found.  Assessment and Plan:   1. Chest pain with moderate risk for cardiac etiology and elevated high sensitivity troponin: -Currently, chest pain free -Minimal elevation of high sensitivity troponin of 29 with a delta and peak of 48, subsequently down trending and now normal -EKG not acute -NPO -Lexiscan MPI -Echo -ASA -If the above work up is unrevealing, no further cardiac testing would be indicated and she can follow up with her PCP  2. HTN: -Blood pressure initially elevated in the ED, likely in the setting of stress/anxiety -Improved -PTA HCTZ -Low sodium diet  3. HLD: -LDL 117 -If she is found to have significant coronary artery calcium on CT attenuated images on MPI, would aim for target LDL < 70  4. Obesity: -Weight loss through heart healthy diet and exercise is recommended    For questions or updates, please contact [facility] Please consult [email or web address] for contact info under Cardiology/STEMI.   Signed, Eula Listen, PA-C [facility] Pager: [phone] 07/10/2020, 8:49 AM

## 2020-07-10 NOTE — Telephone Encounter (Signed)
Copied from CRM [id number]. Topic: General - Other >> Jul 10, 2020  3:30 PM Marylen Ponto wrote: Reason for CRM: Pt stated she was discharged from the hospital but there is nothing on her paperwork advising when she can return back to work. Pt would like to know if any information was provided to pcp on when pt can return back to work. Pt requests call back.   Pt has apt for 07/16/2020

## 2020-07-10 NOTE — Discharge Instructions (Signed)
[email or web address].[email or web address].[email or web address]">  Generalized Anxiety Disorder, Adult Generalized anxiety disorder (GAD) is a mental health condition. Unlike normal worries, anxiety related to GAD is not triggered by a specific event. These worries do not fade or get better with time. GAD interferes with relationships, work, and school. GAD symptoms can vary from mild to severe. People with severe GAD can have intense waves of anxiety with physical symptoms that are similar to panic attacks. What are the causes? The exact cause of GAD is not known, but the following are believed to have an impact:  Differences in natural brain chemicals.  Genes passed down from parents to children.  Differences in the way threats are perceived.  Development during childhood.  Personality. What increases the risk? The following factors may make you more likely to develop this condition:  Being female.  Having a family history of anxiety disorders.  Being very shy.  Experiencing very stressful life events, such as the death of a loved one.  Having a very stressful family environment. What are the signs or symptoms? People with GAD often worry excessively about many things in their lives, such as their health and family. Symptoms may also include:  Mental and emotional symptoms: ? Worrying excessively about natural disasters. ? Fear of being late. ? Difficulty concentrating. ? Fears that others are judging your performance.  Physical symptoms: ? Fatigue. ? Headaches, muscle tension, muscle twitches, trembling, or feeling shaky. ? Feeling like your heart is pounding or beating very fast. ? Feeling out of breath or like you cannot take a deep breath. ? Having trouble falling asleep or staying asleep, or experiencing restlessness. ? Sweating. ? Nausea, diarrhea, or irritable bowel syndrome (IBS).  Behavioral symptoms: ? Experiencing erratic moods or irritability. ? Avoidance of new situations. ? Avoidance of  people. ? Extreme difficulty making decisions. How is this diagnosed? This condition is diagnosed based on your symptoms and medical history. You will also have a physical exam. Your health care provider may perform tests to rule out other possible causes of your symptoms. To be diagnosed with GAD, a person must have anxiety that:  Is out of his or her control.  Affects several different aspects of his or her life, such as work and relationships.  Causes distress that makes him or her unable to take part in normal activities.  Includes at least three symptoms of GAD, such as restlessness, fatigue, trouble concentrating, irritability, muscle tension, or sleep problems. Before your health care provider can confirm a diagnosis of GAD, these symptoms must be present more days than they are not, and they must last for 6 months or longer. How is this treated? This condition may be treated with:  Medicine. Antidepressant medicine is usually prescribed for long-term daily control. Anti-anxiety medicines may be added in severe cases, especially when panic attacks occur.  Talk therapy (psychotherapy). Certain types of talk therapy can be helpful in treating GAD by providing support, education, and guidance. Options include: ? Cognitive behavioral therapy (CBT). People learn coping skills and self-calming techniques to ease their physical symptoms. They learn to identify unrealistic thoughts and behaviors and to replace them with more appropriate thoughts and behaviors. ? Acceptance and commitment therapy (ACT). This treatment teaches people how to be mindful as a way to cope with unwanted thoughts and feelings. ? Biofeedback. This process trains you to manage your body's response (physiological response) through breathing techniques and relaxation methods. You will work with a therapist while machines are used to monitor your physical   symptoms.  Stress management techniques. These include yoga,  meditation, and exercise. A mental health specialist can help determine which treatment is best for you. Some people see improvement with one type of therapy. However, other people require a combination of therapies.   Follow these instructions at home: Lifestyle  Maintain a consistent routine and schedule.  Anticipate stressful situations. Create a plan, and allow extra time to work with your plan.  Practice stress management or self-calming techniques that you have learned from your therapist or your health care provider. General instructions  Take over-the-counter and prescription medicines only as told by your health care provider.  Understand that you are likely to have setbacks. Accept this and be kind to yourself as you persist to take better care of yourself.  Recognize and accept your accomplishments, even if you judge them as small.  Keep all follow-up visits as told by your health care provider. This is important. Contact a health care provider if:  Your symptoms do not get better.  Your symptoms get worse.  You have signs of depression, such as: ? A persistently sad or irritable mood. ? Loss of enjoyment in activities that used to bring you joy. ? Change in weight or eating. ? Changes in sleeping habits. ? Avoiding friends or family members. ? Loss of energy for normal tasks. ? Feelings of guilt or worthlessness. Get help right away if:  You have serious thoughts about hurting yourself or others. If you ever feel like you may hurt yourself or others, or have thoughts about taking your own life, get help right away. Go to your nearest emergency department or:  Call your local emergency services (911 in the [place]  Call a suicide crisis helpline, such as the National Suicide Prevention Lifeline at 1-800-273-8255. This is open 24 hours a day in the [place]  Text the Crisis Text Line at 741741 (in the [place] Summary  Generalized anxiety disorder (GAD) is a mental  health condition that involves worry that is not triggered by a specific event.  People with GAD often worry excessively about many things in their lives, such as their health and family.  GAD may cause symptoms such as restlessness, trouble concentrating, sleep problems, frequent sweating, nausea, diarrhea, headaches, and trembling or muscle twitching.  A mental health specialist can help determine which treatment is best for you. Some people see improvement with one type of therapy. However, other people require a combination of therapies. This information is not intended to replace advice given to you by your health care provider. Make sure you discuss any questions you have with your health care provider. Document Revised: 12/28/2018 Document Reviewed: 12/28/2018 Elsevier Patient Education  2021 Elsevier Inc.  Managing Anxiety, Adult After being diagnosed with an anxiety disorder, you may be relieved to know why you have felt or behaved a certain way. You may also feel overwhelmed about the treatment ahead and what it will mean for your life. With care and support, you can manage this condition and recover from it. How to manage lifestyle changes Managing stress and anxiety Stress is your body's reaction to life changes and events, both good and bad. Most stress will last just a few hours, but stress can be ongoing and can lead to more than just stress. Although stress can play a major role in anxiety, it is not the same as anxiety. Stress is usually caused by something external, such as a deadline, test, or competition. Stress normally passes after the triggering   event has ended.  Anxiety is caused by something internal, such as imagining a terrible outcome or worrying that something will go wrong that will devastate you. Anxiety often does not go away even after the triggering event is over, and it can become long-term (chronic) worry. It is important to understand the differences between  stress and anxiety and to manage your stress effectively so that it does not lead to an anxious response. Talk with your health care provider or a counselor to learn more about reducing anxiety and stress. He or she may suggest tension reduction techniques, such as:  Music therapy. This can include creating or listening to music that you enjoy and that inspires you.  Mindfulness-based meditation. This involves being aware of your normal breaths while not trying to control your breathing. It can be done while sitting or walking.  Centering prayer. This involves focusing on a word, phrase, or sacred image that means something to you and brings you peace.  Deep breathing. To do this, expand your stomach and inhale slowly through your nose. Hold your breath for 3-5 seconds. Then exhale slowly, letting your stomach muscles relax.  Self-talk. This involves identifying thought patterns that lead to anxiety reactions and changing those patterns.  Muscle relaxation. This involves tensing muscles and then relaxing them. Choose a tension reduction technique that suits your lifestyle and personality. These techniques take time and practice. Set aside 5-15 minutes a day to do them. Therapists can offer counseling and training in these techniques. The training to help with anxiety may be covered by some insurance plans. Other things you can do to manage stress and anxiety include:  Keeping a stress/anxiety diary. This can help you learn what triggers your reaction and then learn ways to manage your response.  Thinking about how you react to certain situations. You may not be able to control everything, but you can control your response.  Making time for activities that help you relax and not feeling guilty about spending your time in this way.  Visual imagery and yoga can help you stay calm and relax.   Medicines Medicines can help ease symptoms. Medicines for anxiety include:  Anti-anxiety  drugs.  Antidepressants. Medicines are often used as a primary treatment for anxiety disorder. Medicines will be prescribed by a health care provider. When used together, medicines, psychotherapy, and tension reduction techniques may be the most effective treatment. Relationships Relationships can play a big part in helping you recover. Try to spend more time connecting with trusted friends and family members. Consider going to couples counseling, taking family education classes, or going to family therapy. Therapy can help you and others better understand your condition. How to recognize changes in your anxiety Everyone responds differently to treatment for anxiety. Recovery from anxiety happens when symptoms decrease and stop interfering with your daily activities at home or work. This may mean that you will start to:  Have better concentration and focus. Worry will interfere less in your daily thinking.  Sleep better.  Be less irritable.  Have more energy.  Have improved memory. It is important to recognize when your condition is getting worse. Contact your health care provider if your symptoms interfere with home or work and you feel like your condition is not improving. Follow these instructions at home: Activity  Exercise. Most adults should do the following: ? Exercise for at least 150 minutes each week. The exercise should increase your heart rate and make you sweat (moderate-intensity exercise). ? Strengthening   exercises at least twice a week.  Get the right amount and quality of sleep. Most adults need 7-9 hours of sleep each night. Lifestyle  Eat a healthy diet that includes plenty of vegetables, fruits, whole grains, low-fat dairy products, and lean protein. Do not eat a lot of foods that are high in solid fats, added sugars, or salt.  Make choices that simplify your life.  Do not use any products that contain nicotine or tobacco, such as cigarettes, e-cigarettes, and  chewing tobacco. If you need help quitting, ask your health care provider.  Avoid caffeine, alcohol, and certain over-the-counter cold medicines. These may make you feel worse. Ask your pharmacist which medicines to avoid.   General instructions  Take over-the-counter and prescription medicines only as told by your health care provider.  Keep all follow-up visits as told by your health care provider. This is important. Where to find support You can get help and support from these sources:  Self-help groups.  Online and community organizations.  A trusted spiritual leader.  Couples counseling.  Family education classes.  Family therapy. Where to find more information You may find that joining a support group helps you deal with your anxiety. The following sources can help you locate counselors or support groups near you:  Mental Health America: www.mentalhealthamerica.net  Anxiety and Depression Association of America (ADAA): www.adaa.org  National Alliance on Mental Illness (NAMI): www.nami.org Contact a health care provider if you:  Have a hard time staying focused or finishing daily tasks.  Spend many hours a day feeling worried about everyday life.  Become exhausted by worry.  Start to have headaches, feel tense, or have nausea.  Urinate more than normal.  Have diarrhea. Get help right away if you have:  A racing heart and shortness of breath.  Thoughts of hurting yourself or others. If you ever feel like you may hurt yourself or others, or have thoughts about taking your own life, get help right away. You can go to your nearest emergency department or call:  Your local emergency services (911 in the [place]  A suicide crisis helpline, such as the National Suicide Prevention Lifeline at 1-800-273-8255. This is open 24 hours a day. Summary  Taking steps to learn and use tension reduction techniques can help calm you and help prevent triggering an anxiety  reaction.  When used together, medicines, psychotherapy, and tension reduction techniques may be the most effective treatment.  Family, friends, and partners can play a big part in helping you recover from an anxiety disorder. This information is not intended to replace advice given to you by your health care provider. Make sure you discuss any questions you have with your health care provider. Document Revised: 08/09/2018 Document Reviewed: 08/09/2018 Elsevier Patient Education  2021 Elsevier Inc.  

## 2020-07-15 ENCOUNTER — Telehealth: Payer: Self-pay

## 2020-07-15 NOTE — Telephone Encounter (Signed)
Lvm to r/s provider not credentialed with [facility]

## 2020-07-16 ENCOUNTER — Inpatient Hospital Stay: Payer: BC Managed Care – PPO | Admitting: Nurse Practitioner

## 2020-07-26 ENCOUNTER — Encounter: Payer: Self-pay | Admitting: Nurse Practitioner

## 2020-07-26 ENCOUNTER — Ambulatory Visit: Payer: BC Managed Care – PPO | Admitting: Nurse Practitioner

## 2020-07-26 ENCOUNTER — Other Ambulatory Visit: Payer: Self-pay

## 2020-07-26 VITALS — BP 120/78 | HR 72 | Temp 98.4°F | Wt 231.2 lb

## 2020-07-26 DIAGNOSIS — F419 Anxiety disorder, unspecified: Secondary | ICD-10-CM

## 2020-07-26 DIAGNOSIS — N898 Other specified noninflammatory disorders of vagina: Secondary | ICD-10-CM

## 2020-07-26 LAB — URINALYSIS, ROUTINE W REFLEX MICROSCOPIC
Bilirubin, UA: NEGATIVE
Glucose, UA: NEGATIVE
Ketones, UA: NEGATIVE
Leukocytes,UA: NEGATIVE
Nitrite, UA: NEGATIVE
Protein,UA: NEGATIVE
RBC, UA: NEGATIVE
Specific Gravity, UA: >1.03 — ABNORMAL HIGH (ref 1.005–1.030)
Urobilinogen, Ur: 0.2 mg/dL (ref 0.2–1.0)
pH, UA: 5.5 (ref 5.0–7.5)

## 2020-07-26 LAB — WET PREP FOR TRICH, YEAST, CLUE
Clue Cell Exam: NEGATIVE
Trichomonas Exam: NEGATIVE
Yeast Exam: NEGATIVE

## 2020-07-26 MED ORDER — BUSPIRONE HCL 5 MG PO TABS: 5.0000 mg | ORAL_TABLET | Freq: Two times a day (BID) | ORAL | 1 refills | Status: DC | PRN

## 2020-07-26 NOTE — Assessment & Plan Note (Signed)
Notes panic attacks about twice a month. In between these panic attacks, she doesn't have any symptoms. With this last one, it lasted longer than normal and her blood pressure was up, so she went to the ER. Cardiac work-up was negative. BP controlled today. PHQ-9 is 4 today and GAD 7 is a 1. Discussed daily maintenance treatment versus medication to take prn panic attacks. Since she has not tolerated maintenance treatment in the past, she would like to start prn medication and then possibly daily medication if her panic attacks become more frequent. Buspar sent to the pharmacy. Follow-up in 4 weeks or sooner if any concerns.

## 2020-07-26 NOTE — Patient Instructions (Signed)
https://www.nimh.nih.gov/health/topics/anxiety-disorders/index.shtml">  Panic Attack A panic attack is when you suddenly feel very afraid, uncomfortable, or nervous (anxious). A panic attack can happen when you are scared or for no reason. A panic attack can feel like a serious problem. It can even feel like a heart attack or stroke. See your doctor when you have a panic attack to make sure you do not have a serious problem. Follow these instructions at home:  Take medicines only as told by your doctor.  If you feel worried or nervous, try not to have caffeine.  Take good care of your health. To do this: ? Eat healthy. Make sure to eat fresh fruits and vegetables, whole grains, lean meats, and low-fat dairy. ? Get enough sleep. Try to sleep for 7-8 hours each night. ? Exercise. Try to be active for 30 minutes 5 or more days a week. ? Do not smoke. Talk to your doctor if you need help quitting. ? Limit how much alcohol you drink:  If you are a woman who is not pregnant: try not to have more than 1 drink a day.  If you are a man: try not to have more than 2 drinks a day.  One drink equals 12 oz of beer, 5 oz of wine, or 1 oz of hard liquor.  Keep all follow-up visits as told by your doctor. This is important.   Contact a doctor if:  Your symptoms do not get better.  Your symptoms get worse.  You are not able to take your medicines as told. Get help right away if:  You have thoughts of hurting yourself or others.  You have symptoms of a panic attack. Do not drive yourself to the hospital. Have someone else drive you or call an ambulance. If you feel like you may hurt yourself or others, or have thoughts about taking your own life, get help right away. You can go to your nearest emergency department or call:  Your local emergency services (911 in the [place]  A suicide crisis helpline, such as the National Suicide Prevention Lifeline at 1-800-273-8255. This is open 24 hours a  day. Summary  A panic attack is when you suddenly feel very afraid, uncomfortable, or nervous (anxious).  See your doctor when you have a panic attack to make sure that you do not have another serious problem.  If you feel like you may hurt yourself or others, get help right away by calling 911. This information is not intended to replace advice given to you by your health care provider. Make sure you discuss any questions you have with your health care provider. Document Revised: 09/07/2019 Document Reviewed: 09/07/2019 Elsevier Patient Education  2021 Elsevier Inc.  

## 2020-07-26 NOTE — Progress Notes (Signed)
Established Patient Office Visit  Subjective:  Patient ID: Kari Brandt, female    DOB: [birth year]  Age: 50 y.o. MRN: [id number]  CC:  Chief Complaint  Patient presents with  . Anxiety    HPI Kari Brandt presents for recent hospitalization for chest pain. She was admitted 07/09/20 and discharged 07/10/20. Stress test and cardiac work-up was negative. Symptoms could be from anxiety. She was discharged with 10 tablets of xanax and she has only needed to take 2 tablets in the last 2 weeks. She has tried daily medication in the past for anxiety, but it kept her awake at night. She does not remember the names of these medications.   ANXIETY/STRESS   Duration:exacerbated Anxious mood: no  Excessive worrying: no Irritability: no  Sweating: yes Nausea: yes, with panic attacks Palpitations:yes with panic attacks Hyperventilation: yes with panic attacks Panic attacks: yes Agoraphobia: no  Obscessions/compulsions: no Depressed mood: intermittent Depression screen [facility] 2/9 07/26/2020 02/07/2020 05/02/2019 04/03/2019  Decreased Interest 0 0 0 0  Down, Depressed, Hopeless 2 0 0 0  PHQ - 2 Score 2 0 0 0  Altered sleeping 2 - 1 2  Tired, decreased energy 0 - 1 1  Change in appetite 0 - 0 0  Feeling bad or failure about yourself  0 - 0 0  Trouble concentrating 0 - 0 0  Moving slowly or fidgety/restless 0 - 0 0  Suicidal thoughts 0 - 0 0  PHQ-9 Score 4 - 2 3  Difficult doing work/chores Not difficult at all - Not difficult at all -   GAD 7 : Generalized Anxiety Score 07/26/2020 05/02/2019  Nervous, Anxious, on Edge 0 1  Control/stop worrying 0 1  Worry too much - different things 0 1  Trouble relaxing 0 2  Restless 1 0  Easily annoyed or irritable 0 1  Afraid - awful might happen 0 0  Total GAD 7 Score 1 6  Anxiety Difficulty Not difficult at all Not difficult at all   Anhedonia: no Weight changes: no Insomnia: yes hard to fall asleep  Hypersomnia: no Fatigue/loss of  energy: somtimes Feelings of worthlessness: no Feelings of guilt: no Impaired concentration/indecisiveness: no Suicidal ideations: no  Crying spells: yes Recent Stressors/Life Changes: no   Relationship problems: no   Family stress: no     Financial stress: no    Job stress: no    Recent death/loss: no  VAGINAL DISCHARGE  Duration: weeks Discharge description: white  Pruritus: yes Dysuria: no Malodorous: no Urinary frequency: no Fevers: no Abdominal pain: no  Recent antibiotic use: no Context: was better, then got worse again Treatments attempted: eating yogurt  Past Medical History:  Diagnosis Date  . Anxiety   . GERD (gastroesophageal reflux disease)   . Hypertension     Past Surgical History:  Procedure Laterality Date  . ENDOMETRIAL ABLATION  2007   her option  . TUBAL LIGATION    . WRIST SURGERY      Family History  Problem Relation Age of Onset  . Diabetes Mother   . Heart disease Mother   . Kidney disease Mother   . Anxiety disorder Sister   . Heart disease Maternal Grandmother   . Breast cancer Neg Hx     Social History   Socioeconomic History  . Marital status: Married    Spouse name: Not on file  . Number of children: Not on file  . Years of education: Not on file  . Highest  education level: Not on file  Occupational History  . Not on file  Tobacco Use  . Smoking status: Never Smoker  . Smokeless tobacco: Never Used  Vaping Use  . Vaping Use: Never used  Substance and Sexual Activity  . Alcohol use: Yes    Comment: occassionally  . Drug use: Never  . Sexual activity: Yes    Birth control/protection: None, Surgical    Comment: Tubal Ligation  Other Topics Concern  . Not on file  Social History Narrative  . Not on file   Social Determinants of Health   Financial Resource Strain: Not on file  Food Insecurity: Not on file  Transportation Needs: Not on file  Physical Activity: Not on file  Stress: Not on file  Social  Connections: Not on file  Intimate Partner Violence: Not on file    Outpatient Medications Prior to Visit  Medication Sig Dispense Refill  . ALPRAZolam (XANAX) 0.25 MG tablet Take 0.25 mg by mouth 2 (two) times daily as needed for anxiety.    . Ascorbic Acid (VITAMIN C) 1000 MG tablet Take 1,000 mg by mouth daily.    . ASPIRIN 81 PO Take 1 tablet by mouth daily.    . hydrochlorothiazide (HYDRODIURIL) 25 MG tablet Take 1 tablet (25 mg total) by mouth daily. for high blood pressure 90 tablet 1  . pantoprazole (PROTONIX) 40 MG tablet Take 1 tablet (40 mg total) by mouth daily as needed. 30 tablet 3  . traZODone (DESYREL) 50 MG tablet Take 0.5-1 tablets (25-50 mg total) by mouth at bedtime as needed for sleep. 90 tablet 1  . VITAMIN D PO Take by mouth.     No facility-administered medications prior to visit.    No Known Allergies  ROS Review of Systems  Constitutional: Positive for fatigue.  Eyes: Negative.   Cardiovascular: Negative.   Gastrointestinal: Positive for constipation and nausea (with panic attacks). Negative for abdominal pain.  Genitourinary: Negative.   Musculoskeletal: Negative.   Skin: Negative.   Neurological: Negative.   Psychiatric/Behavioral: The patient is nervous/anxious.       Objective:    Physical Exam  BP 120/78   Pulse 72   Temp 98.4 F (36.9 C) (Oral)   Wt 231 lb 3.2 oz (104.9 kg)   LMP 07/18/2020 (Exact Date)   SpO2 97%   BMI 43.68 kg/m  Wt Readings from Last 3 Encounters:  07/26/20 231 lb 3.2 oz (104.9 kg)  07/10/20 228 lb 8 oz (103.6 kg)  03/06/20 231 lb (104.8 kg)     Health Maintenance Due  Topic Date Due  . PAP SMEAR-Modifier  Never done  . COLONOSCOPY (Pts 45-35yrs Insurance coverage will need to be confirmed)  Never done    There are no preventive care reminders to display for this patient.  Lab Results  Component Value Date   TSH 1.620 06/29/2019   Lab Results  Component Value Date   WBC 8.4 07/09/2020   HGB 13.6  07/09/2020   HCT 40.4 07/09/2020   MCV 86.1 07/09/2020   PLT 355 07/09/2020   Lab Results  Component Value Date   NA 140 07/09/2020   K 3.7 07/09/2020   CO2 25 07/09/2020   GLUCOSE 116 (H) 07/09/2020   BUN 13 07/09/2020   CREATININE 0.67 07/09/2020   BILITOT 0.3 06/29/2019   ALKPHOS 141 (H) 06/29/2019   AST 21 06/29/2019   ALT 27 06/29/2019   PROT 7.2 06/29/2019   ALBUMIN 4.5 06/29/2019  CALCIUM 9.2 07/09/2020   ANIONGAP 8 07/09/2020   Lab Results  Component Value Date   CHOL 200 07/10/2020   Lab Results  Component Value Date   HDL 34 (L) 07/10/2020   Lab Results  Component Value Date   LDLCALC 117 (H) 07/10/2020   Lab Results  Component Value Date   TRIG 244 (H) 07/10/2020   Lab Results  Component Value Date   CHOLHDL 5.9 07/10/2020   No results found for: HGBA1C    Assessment & Plan:   Problem List Items Addressed This Visit      Other   Anxiety    Notes panic attacks about twice a month. In between these panic attacks, she doesn't have any symptoms. With this last one, it lasted longer than normal and her blood pressure was up, so she went to the ER. Cardiac work-up was negative. BP controlled today. PHQ-9 is 4 today and GAD 7 is a 1. Discussed daily maintenance treatment versus medication to take prn panic attacks. Since she has not tolerated maintenance treatment in the past, she would like to start prn medication and then possibly daily medication if her panic attacks become more frequent. Buspar sent to the pharmacy. Follow-up in 4 weeks or sooner if any concerns.       Relevant Medications   ALPRAZolam (XANAX) 0.25 MG tablet   busPIRone (BUSPAR) 5 MG tablet    Other Visit Diagnoses    Vaginal itching    -  Primary   U/A and wet prep negative. Discussed peri care with non-irritating soaps and to make sure she is dry. Call if symptoms don't improve.    Relevant Orders   WET PREP FOR TRICH, YEAST, CLUE   Urinalysis, Routine w reflex microscopic       Meds ordered this encounter  Medications  . busPIRone (BUSPAR) 5 MG tablet    Sig: Take 1 tablet (5 mg total) by mouth 2 (two) times daily as needed (anxiety, panic).    Dispense:  30 tablet    Refill:  1    Follow-up: Return in about 4 weeks (around 08/23/2020) for anxiety.    Gerre Scull, NP

## 2020-07-29 DIAGNOSIS — D3130 Benign neoplasm of unspecified choroid: Secondary | ICD-10-CM | POA: Diagnosis not present

## 2020-08-15 ENCOUNTER — Other Ambulatory Visit: Payer: Self-pay

## 2020-08-15 MED ORDER — HYDROCHLOROTHIAZIDE 25 MG PO TABS: 25.0000 mg | ORAL_TABLET | Freq: Every day | ORAL | 1 refills | Status: DC

## 2020-08-15 NOTE — Telephone Encounter (Signed)
Called pt to r/s appt she states that she is needing a refill on her hydrochlorothizide pt is scheduled 6/6

## 2020-08-23 ENCOUNTER — Ambulatory Visit: Payer: BC Managed Care – PPO | Admitting: Nurse Practitioner

## 2020-08-25 NOTE — Progress Notes (Deleted)
   There were no vitals taken for this visit.   Subjective:    Patient ID: Kari Brandt, female    DOB: 04/05/1970, 50 y.o.   MRN: [id number]  HPI: Kari Brandt is a 50 y.o. female  No chief complaint on file.  ANXIETY/STRESS Duration:{Blank single:19197::"controlled","uncontrolled","better","worse","exacerbated","stable"} Anxious mood: {Blank single:19197::"yes","no"}  Excessive worrying: {Blank single:19197::"yes","no"} Irritability: {Blank single:19197::"yes","no"}  Sweating: {Blank single:19197::"yes","no"} Nausea: {Blank single:19197::"yes","no"} Palpitations:{Blank single:19197::"yes","no"} Hyperventilation: {Blank single:19197::"yes","no"} Panic attacks: {Blank single:19197::"yes","no"} Agoraphobia: {Blank single:19197::"yes","no"}  Obscessions/compulsions: {Blank single:19197::"yes","no"} Depressed mood: {Blank single:19197::"yes","no"} Depression screen [facility] 2/9 07/26/2020 02/07/2020 05/02/2019 04/03/2019  Decreased Interest 0 0 0 0  Down, Depressed, Hopeless 2 0 0 0  PHQ - 2 Score 2 0 0 0  Altered sleeping 2 - 1 2  Tired, decreased energy 0 - 1 1  Change in appetite 0 - 0 0  Feeling bad or failure about yourself  0 - 0 0  Trouble concentrating 0 - 0 0  Moving slowly or fidgety/restless 0 - 0 0  Suicidal thoughts 0 - 0 0  PHQ-9 Score 4 - 2 3  Difficult doing work/chores Not difficult at all - Not difficult at all -   Anhedonia: {Blank single:19197::"yes","no"} Weight changes: {Blank single:19197::"yes","no"} Insomnia: {Blank single:19197::"yes","no"} {Blank single:19197::"hard to fall asleep","hard to stay asleep"}  Hypersomnia: {Blank single:19197::"yes","no"} Fatigue/loss of energy: {Blank single:19197::"yes","no"} Feelings of worthlessness: {Blank single:19197::"yes","no"} Feelings of guilt: {Blank single:19197::"yes","no"} Impaired concentration/indecisiveness: {Blank single:19197::"yes","no"} Suicidal ideations: {Blank single:19197::"yes","no"}   Crying spells: {Blank single:19197::"yes","no"} Recent Stressors/Life Changes: {Blank single:19197::"yes","no"}   Relationship problems: {Blank single:19197::"yes","no"}   Family stress: {Blank single:19197::"yes","no"}     Financial stress: {Blank single:19197::"yes","no"}    Job stress: {Blank single:19197::"yes","no"}    Recent death/loss: {Blank single:19197::"yes","no"}   Relevant past medical, surgical, family and social history reviewed and updated as indicated. Interim medical history since our last visit reviewed. Allergies and medications reviewed and updated.  Review of Systems  Per HPI unless specifically indicated above     Objective:    There were no vitals taken for this visit.  Wt Readings from Last 3 Encounters:  07/26/20 231 lb 3.2 oz (104.9 kg)  07/10/20 228 lb 8 oz (103.6 kg)  03/06/20 231 lb (104.8 kg)    Physical Exam  Results for orders placed or performed in visit on 07/26/20  WET PREP FOR TRICH, YEAST, CLUE   Specimen: Sterile Swab   Sterile Swab  Result Value Ref Range   Trichomonas Exam Negative Negative   Yeast Exam Negative Negative   Clue Cell Exam Negative Negative  Urinalysis, Routine w reflex microscopic  Result Value Ref Range   Specific Gravity, UA >1.030 (H) 1.005 - 1.030   pH, UA 5.5 5.0 - 7.5   Color, UA Yellow Yellow   Appearance Ur Clear Clear   Leukocytes,UA Negative Negative   Protein,UA Negative Negative/Trace   Glucose, UA Negative Negative   Ketones, UA Negative Negative   RBC, UA Negative Negative   Bilirubin, UA Negative Negative   Urobilinogen, Ur 0.2 0.2 - 1.0 mg/dL   Nitrite, UA Negative Negative      Assessment & Plan:   Problem List Items Addressed This Visit      Other   Anxiety - Primary       Follow up plan: No follow-ups on file.

## 2020-08-26 ENCOUNTER — Ambulatory Visit: Payer: BC Managed Care – PPO | Admitting: Nurse Practitioner

## 2020-08-26 DIAGNOSIS — F419 Anxiety disorder, unspecified: Secondary | ICD-10-CM

## 2020-09-03 ENCOUNTER — Ambulatory Visit: Payer: BC Managed Care – PPO | Admitting: Nurse Practitioner

## 2020-09-03 ENCOUNTER — Other Ambulatory Visit: Payer: Self-pay

## 2020-09-03 ENCOUNTER — Encounter: Payer: Self-pay | Admitting: Nurse Practitioner

## 2020-09-03 VITALS — BP 128/78 | HR 80 | Temp 98.6°F | Wt 231.0 lb

## 2020-09-03 DIAGNOSIS — F419 Anxiety disorder, unspecified: Secondary | ICD-10-CM | POA: Diagnosis not present

## 2020-09-03 DIAGNOSIS — Z1231 Encounter for screening mammogram for malignant neoplasm of breast: Secondary | ICD-10-CM

## 2020-09-03 DIAGNOSIS — I10 Essential (primary) hypertension: Secondary | ICD-10-CM

## 2020-09-03 NOTE — Assessment & Plan Note (Signed)
Slightly elevated in office today, does not check at home No med changes made today. Will obtain labs today and reassess in 3 months. If remains elevated in office will discuss additional medication at next visit.

## 2020-09-03 NOTE — Assessment & Plan Note (Addendum)
Chronic.  Ongoing.  Anxiety has been better controlled. Would like to continue on PRN medication vs starting maintenance. Has not tried Buspar.  Discussed that she will need to start using that to help control panic attacks due to increased risk of dependency and side effects of Xanax.  Follow up in 3 months for reevaluation.

## 2020-09-03 NOTE — Progress Notes (Signed)
BP 128/78   Pulse 80   Temp 98.6 F (37 C)   Wt 231 lb (104.8 kg)   SpO2 97%   BMI 43.65 kg/m    Subjective:    Patient ID: Kari Brandt, female    DOB: 1970/04/14, 50 y.o.   MRN: [id number]  HPI: Kari Brandt is a 50 y.o. female  Chief Complaint  Patient presents with   Anxiety   Hypertension    ANXIETY Patient states her anxiety is about the same.  Patient states shes had about 4-5 panic attacks in the last month.  She took the Xanax she received at the hospital.  States that worked well for her.  She has not tried the Buspar that she received her at last visit.  She would like to continue on the PRN medication.   HYPERTENSION Patient states she does not check her blood pressures at home.  Patient is currently taking HCTZ for blood pressure control.    Denies HA, CP, SOB, dizziness, palpitations, visual changes, and lower extremity swelling.  Patient states her left nipple has been sore.  She is on her period and it started about the same time as her menstrual cycle.  Patient denies any redness, itching or drainage.  States it is more sensitive than normal.  Relevant past medical, surgical, family and social history reviewed and updated as indicated. Interim medical history since our last visit reviewed. Allergies and medications reviewed and updated.  Review of Systems  Eyes:  Negative for visual disturbance.  Respiratory:  Negative for cough, chest tightness and shortness of breath.   Cardiovascular:  Negative for chest pain, palpitations and leg swelling.  Skin:        Sensitive nipple  Neurological:  Negative for dizziness and headaches.  Psychiatric/Behavioral:  The patient is nervous/anxious.    Per HPI unless specifically indicated above     Objective:    BP 128/78   Pulse 80   Temp 98.6 F (37 C)   Wt 231 lb (104.8 kg)   SpO2 97%   BMI 43.65 kg/m   Wt Readings from Last 3 Encounters:  09/03/20 231 lb (104.8 kg)  07/26/20 231 lb  3.2 oz (104.9 kg)  07/10/20 228 lb 8 oz (103.6 kg)    Physical Exam Vitals and nursing note reviewed.  Constitutional:      General: She is not in acute distress.    Appearance: Normal appearance. She is normal weight. She is not ill-appearing, toxic-appearing or diaphoretic.  HENT:     Head: Normocephalic.     Right Ear: External ear normal.     Left Ear: External ear normal.     Nose: Nose normal.     Mouth/Throat:     Mouth: Mucous membranes are moist.     Pharynx: Oropharynx is clear.  Eyes:     General:        Right eye: No discharge.        Left eye: No discharge.     Extraocular Movements: Extraocular movements intact.     Conjunctiva/sclera: Conjunctivae normal.     Pupils: Pupils are equal, round, and reactive to light.  Cardiovascular:     Rate and Rhythm: Normal rate and regular rhythm.     Heart sounds: No murmur heard. Pulmonary:     Effort: Pulmonary effort is normal. No respiratory distress.     Breath sounds: Normal breath sounds. No wheezing or rales.  Chest:  Breasts:  Breasts are symmetrical.     Right: Normal.     Left: Normal.  Musculoskeletal:     Cervical back: Normal range of motion and neck supple.  Skin:    General: Skin is warm and dry.     Capillary Refill: Capillary refill takes less than 2 seconds.  Neurological:     General: No focal deficit present.     Mental Status: She is alert and oriented to person, place, and time. Mental status is at baseline.  Psychiatric:        Mood and Affect: Mood normal.        Behavior: Behavior normal.        Thought Content: Thought content normal.        Judgment: Judgment normal.    Results for orders placed or performed in visit on 07/26/20  WET PREP FOR TRICH, YEAST, CLUE   Specimen: Sterile Swab   Sterile Swab  Result Value Ref Range   Trichomonas Exam Negative Negative   Yeast Exam Negative Negative   Clue Cell Exam Negative Negative  Urinalysis, Routine w reflex microscopic  Result  Value Ref Range   Specific Gravity, UA >1.030 (H) 1.005 - 1.030   pH, UA 5.5 5.0 - 7.5   Color, UA Yellow Yellow   Appearance Ur Clear Clear   Leukocytes,UA Negative Negative   Protein,UA Negative Negative/Trace   Glucose, UA Negative Negative   Ketones, UA Negative Negative   RBC, UA Negative Negative   Bilirubin, UA Negative Negative   Urobilinogen, Ur 0.2 0.2 - 1.0 mg/dL   Nitrite, UA Negative Negative      Assessment & Plan:   Problem List Items Addressed This Visit       Cardiovascular and Mediastinum   Hypertension    Slightly elevated in office today, does not check at home No med changes made today. Will obtain labs today and reassess in 3 months. If remains elevated in office will discuss additional medication at next visit.         Other   Anxiety - Primary    Chronic.  Ongoing.  Anxiety has been better controlled. Would like to continue on PRN medication vs starting maintenance. Has not tried Buspar.  Discussed that she will need to start using that to help control panic attacks due to increased risk of dependency and side effects of Xanax.  Follow up in 3 months for reevaluation.        Other Visit Diagnoses     Encounter for screening mammogram for malignant neoplasm of breast       Relevant Orders   MM Digital Screening        Follow up plan: Return in about 3 months (around 12/04/2020) for Depression/Anxiety FU, BP Check.   A total of 30 minutes were spent on this encounter today.  When total time is documented, this includes both the face-to-face and non-face-to-face time personally spent before, during and after the visit on the date of the encounter.

## 2020-10-14 ENCOUNTER — Emergency Department
Admission: EM | Admit: 2020-10-14 | Discharge: 2020-10-14 | Disposition: A | Discharge status: Home / Self Care | Payer: BC Managed Care – PPO | Attending: Emergency Medicine | Admitting: Emergency Medicine

## 2020-10-14 ENCOUNTER — Ambulatory Visit: Payer: Self-pay

## 2020-10-14 ENCOUNTER — Other Ambulatory Visit: Payer: Self-pay

## 2020-10-14 DIAGNOSIS — I10 Essential (primary) hypertension: Secondary | ICD-10-CM

## 2020-10-14 DIAGNOSIS — M545 Low back pain, unspecified: Secondary | ICD-10-CM | POA: Diagnosis not present

## 2020-10-14 DIAGNOSIS — R519 Headache, unspecified: Secondary | ICD-10-CM | POA: Diagnosis not present

## 2020-10-14 DIAGNOSIS — Z79899 Other long term (current) drug therapy: Secondary | ICD-10-CM | POA: Diagnosis not present

## 2020-10-14 DIAGNOSIS — Z7982 Long term (current) use of aspirin: Secondary | ICD-10-CM | POA: Diagnosis not present

## 2020-10-14 LAB — TROPONIN I (HIGH SENSITIVITY): Troponin I (High Sensitivity): 3 ng/L (ref <18)

## 2020-10-14 LAB — CBC
HCT: 38.2 % (ref 36.0–46.0)
Hemoglobin: 12.8 g/dL (ref 12.0–15.0)
MCH: 29.6 pg (ref 26.0–34.0)
MCHC: 33.5 g/dL (ref 30.0–36.0)
MCV: 88.2 fL (ref 80.0–100.0)
Platelets: 300 10*3/uL (ref 150–400)
RBC: 4.33 MIL/uL (ref 3.87–5.11)
RDW: 13.3 % (ref 11.5–15.5)
WBC: 6 10*3/uL (ref 4.0–10.5)
nRBC: 0 % (ref 0.0–0.2)

## 2020-10-14 LAB — BASIC METABOLIC PANEL
Anion gap: 10 (ref 5–15)
BUN: 12 mg/dL (ref 6–20)
CO2: 26 mmol/L (ref 22–32)
Calcium: 8.7 mg/dL — ABNORMAL LOW (ref 8.9–10.3)
Chloride: 102 mmol/L (ref 98–111)
Creatinine, Ser: 0.73 mg/dL (ref 0.44–1.00)
GFR, Estimated: >60 mL/min (ref >60)
Glucose, Bld: 146 mg/dL — ABNORMAL HIGH (ref 70–99)
Potassium: 3.6 mmol/L (ref 3.5–5.1)
Sodium: 138 mmol/L (ref 135–145)

## 2020-10-14 MED ORDER — METHOCARBAMOL 500 MG PO TABS: 500.0000 mg | ORAL_TABLET | Freq: Four times a day (QID) | ORAL | 0 refills | Status: DC

## 2020-10-14 MED ORDER — NAPROXEN 500 MG PO TABS: 500.0000 mg | ORAL_TABLET | Freq: Two times a day (BID) | ORAL | 0 refills | Status: DC

## 2020-10-14 NOTE — ED Provider Notes (Signed)
[facility] Emergency Department Provider Note ____________________________________________   None    (approximate)  I have reviewed the triage vital signs and the nursing notes.   HISTORY  Chief Complaint Back Pain and Hypertension  HPI Kari Brandt is a 50 y.o. female with history of Hypertension and GERD presents to the emergency department for treatment and evaluation of hypertension and back pain. Pain started earlier today while at work. After pain started, she developed some nausea and a headache. She didn't take anything because she hadn't eaten and didn't want anything on an empty stomach. No previous pain like this in the past. No dysuria or urinary symptoms.         Past Medical History:  Diagnosis Date   Anxiety    GERD (gastroesophageal reflux disease)    Hypertension     Patient Active Problem List   Diagnosis Date Noted   Elevated troponin    Chest pain 07/09/2020   BMI 40.0-44.9, adult (HCC) 02/04/2020   Vitamin D deficiency 02/04/2020   Migraine 04/20/2019   Hypertension    Anxiety    Insomnia    GERD (gastroesophageal reflux disease)     Past Surgical History:  Procedure Laterality Date   ENDOMETRIAL ABLATION  2007   her option   TUBAL LIGATION     WRIST SURGERY      Prior to Admission medications   Medication Sig Start Date End Date Taking? Authorizing Provider  methocarbamol (ROBAXIN) 500 MG tablet Take 1 tablet (500 mg total) by mouth 4 (four) times daily. 10/14/20  Yes Jaylynn Mcaleer B, FNP  naproxen (NAPROSYN) 500 MG tablet Take 1 tablet (500 mg total) by mouth 2 (two) times daily with a meal. 10/14/20  Yes Lera Gaines B, FNP  ALPRAZolam (XANAX) 0.25 MG tablet Take 0.25 mg by mouth 2 (two) times daily as needed for anxiety.    [provider]  Ascorbic Acid (VITAMIN C) 1000 MG tablet Take 1,000 mg by mouth daily.    [provider]  ASPIRIN 81 PO Take 1 tablet by mouth daily.     [provider]  busPIRone (BUSPAR) 5 MG tablet Take 1 tablet (5 mg total) by mouth 2 (two) times daily as needed (anxiety, panic). 07/26/20   McElwee, Lauren A, NP  hydrochlorothiazide (HYDRODIURIL) 25 MG tablet Take 1 tablet (25 mg total) by mouth daily. for high blood pressure 08/15/20   [name], NP  pantoprazole (PROTONIX) 40 MG tablet Take 1 tablet (40 mg total) by mouth daily as needed. 02/07/20   Caro Laroche, DO  traZODone (DESYREL) 50 MG tablet Take 0.5-1 tablets (25-50 mg total) by mouth at bedtime as needed for sleep. 05/02/19   Particia Nearing, PA-C  VITAMIN D PO Take by mouth.    [provider]    Allergies Patient has no known allergies.  Family History  Problem Relation Age of Onset   Diabetes Mother    Heart disease Mother    Kidney disease Mother    Anxiety disorder Sister    Heart disease Maternal Grandmother    Breast cancer Neg Hx     Social History Social History   Tobacco Use   Smoking status: Never   Smokeless tobacco: Never  Vaping Use   Vaping Use: Never used  Substance Use Topics   Alcohol use: Yes    Comment: occassionally   Drug use: Never    Review of Systems  Constitutional: No fever/chills Eyes:  No visual changes. ENT: No sore throat. Cardiovascular: Denies chest pain. Respiratory: Denies shortness of breath. Gastrointestinal: No abdominal pain.  Positive for nausea, no vomiting.  No diarrhea.  No constipation. Genitourinary: Negative for dysuria. Musculoskeletal: Positive for back pain. Skin: Negative for rash. Neurological: Negative for headaches, focal weakness or numbness. ____________________________________________   PHYSICAL EXAM:  VITAL SIGNS: ED Triage Vitals [10/14/20 1029]  Enc Vitals Group     BP (!) 185/84     Pulse Rate 99     Resp 16     Temp 99.5 F (37.5 C)     Temp Source Oral     SpO2 99 %     Weight 230 lb (104.3 kg)     Height 5' (1.524 m)     Head Circumference       Peak Flow      Pain Score 6     Pain Loc      Pain Edu?      Excl. in GC?     Constitutional: Alert and oriented. Well appearing and in no acute distress. Eyes: Conjunctivae are normal.  Head: Atraumatic. Nose: No congestion/rhinnorhea. Mouth/Throat: Mucous membranes are moist.  Neck: No stridor.   Hematological/Lymphatic/Immunilogical: No cervical lymphadenopathy. Cardiovascular: Normal rate, regular rhythm. Grossly normal heart sounds.  Good peripheral circulation. Respiratory: Normal respiratory effort.  No retractions. Lungs CTAB. Gastrointestinal: Soft and nontender. No distention. No abdominal bruits. No CVA tenderness. Genitourinary:  Musculoskeletal: No lower extremity tenderness nor edema. Transverse lower lumbar back pain without focal area of pain. Neurologic:  Normal speech and language. No gross focal neurologic deficits are appreciated. No gait instability. Skin:  Skin is warm, dry and intact. No rash noted. Psychiatric: Mood and affect are normal. Speech and behavior are normal.  ____________________________________________   LABS (all labs ordered are listed, but only abnormal results are displayed)  Labs Reviewed  BASIC METABOLIC PANEL - Abnormal; Notable for the following components:      Result Value   Glucose, Bld 146 (*)    Calcium 8.7 (*)    All other components within normal limits  CBC  TROPONIN I (HIGH SENSITIVITY)   ____________________________________________  EKG  Not indicated. ____________________________________________  RADIOLOGY  ED MD interpretation:    Not indicated.  I, Kem Boroughs, personally viewed and evaluated these images (plain radiographs) as part of my medical decision making, as well as reviewing the written report by the radiologist.  Official radiology report(s): No results found.  ____________________________________________   PROCEDURES  Procedure(s) performed (including Critical  Care):  Procedures  ____________________________________________   INITIAL IMPRESSION / ASSESSMENT AND PLAN     50 year old female presenting to the ER for back pain and hypertension. See HPI. While awaiting evaluation, labs were drawn and are reassuring. Patient having no chest pain or shortness of breath. Exam is reassuring. Hypertension likely related to pain. No additional BP medication required at this time. She will follow up with PCP for repeat and medication adjustment if necessary. Back pain will be treated with naprosyn and Robaxin. For symptoms that are not improving, change or worsen, she is to return to the ER if unable to see PCP.   ___________________________________________   FINAL CLINICAL IMPRESSION(S) / ED DIAGNOSES  Final diagnoses:  Acute midline low back pain without sciatica  Hypertension, unspecified type     ED Discharge Orders          Ordered    methocarbamol (ROBAXIN) 500 MG tablet  4 times daily  10/14/20 1225    naproxen (NAPROSYN) 500 MG tablet  2 times daily with meals        10/14/20 1225             Kari Brandt was evaluated in Emergency Department on 10/14/2020 for the symptoms described in the history of present illness. She was evaluated in the context of the global COVID-19 pandemic, which necessitated consideration that the patient might be at risk for infection with the SARS-CoV-2 virus that causes COVID-19. Institutional protocols and algorithms that pertain to the evaluation of patients at risk for COVID-19 are in a state of rapid change based on information released by regulatory bodies including the CDC and federal and state organizations. These policies and algorithms were followed during the patient's care in the ED.   Note:  This document was prepared using Dragon voice recognition software and may include unintentional dictation errors.    Chinita Pester, FNP 10/14/20 Aldona Lento    Jene Every, MD 10/14/20  [phone]

## 2020-10-14 NOTE — Telephone Encounter (Signed)
FYI--pt going to ED

## 2020-10-14 NOTE — Telephone Encounter (Signed)
Pt.'s husband reports pt. Started feeling bad this morning. Headache and weakness.Pain is 5/10. No missed doses of medications. Pt. Currently laying down at work. Husband concerned due to weakness. BP 185/102 and 200/107. Will go to ED.

## 2020-10-14 NOTE — Telephone Encounter (Signed)
Reason for Disposition  [1] Systolic BP  >= 160 OR Diastolic >= 100 AND [2] cardiac or neurologic symptoms (e.g., chest pain, difficulty breathing, unsteady gait, blurred vision)  Answer Assessment - Initial Assessment Questions 1. BLOOD PRESSURE: "What is the blood pressure?" "Did you take at least two measurements 5 minutes apart?"     185/102  200/107 2. ONSET: "When did you take your blood pressure?"     This morning 3. HOW: "How did you obtain the blood pressure?" (e.g., visiting nurse, automatic home BP monitor)     Automatic cuff 4. HISTORY: "Do you have a history of high blood pressure?"     Yes 5. MEDICATIONS: "Are you taking any medications for blood pressure?" "Have you missed any doses recently?"     No missed doses 6. OTHER SYMPTOMS: "Do you have any symptoms?" (e.g., headache, chest pain, blurred vision, difficulty breathing, weakness)     Headache, weak 7. PREGNANCY: "Is there any chance you are pregnant?" "When was your last menstrual period?"     No  Protocols used: Blood Pressure - High-A-AH

## 2020-10-14 NOTE — ED Triage Notes (Signed)
Pt here with HBP and back pain that started this morning. Pt states that she thinks that she slept wrong but does not remember injuring her back. Pt is on medication for hypertension and has been taking it as scheduled.

## 2020-12-03 NOTE — Progress Notes (Signed)
BP 136/80   Pulse 74   Temp 98.1 F (36.7 C) (Oral)   Ht 5' 1.3" (1.557 m)   Wt 229 lb (103.9 kg)   LMP 10/01/2020 (Approximate)   SpO2 97%   BMI 42.85 kg/m    Subjective:    Patient ID: Kari Brandt, female    DOB: July 16, 1970, 50 y.o.   MRN: [id number]  HPI: Kari Brandt is a 50 y.o. female  Chief Complaint  Patient presents with   Anxiety   Hypertension   HYPERTENSION / HYPERLIPIDEMIA Satisfied with current treatment? no Duration of hypertension: years BP monitoring frequency: not checking BP range:  BP medication side effects: no Past BP meds: HCTZ Duration of hyperlipidemia: years Cholesterol medication side effects: no Cholesterol supplements: none Past cholesterol medications: none Medication compliance: excellent compliance Aspirin: yes Recent stressors:  husband is currently having back surgery Recurrent headaches: yes Visual changes: no Palpitations: no Dyspnea: no Chest pain: no Lower extremity edema: no Dizzy/lightheaded: no  ANXIETY Patient states she is about the same.  Takes Buspar 85m BID PRN.  Feels like it is working well for her and denies needing other medication.   GAD 7 : Generalized Anxiety Score 12/04/2020 09/03/2020 07/26/2020 05/02/2019  Nervous, Anxious, on Edge 1 0 0 1  Control/stop worrying 0 2 0 1  Worry too much - different things 0 0 0 1  Trouble relaxing 1 0 0 2  Restless 1 0 1 0  Easily annoyed or irritable 0 0 0 1  Afraid - awful might happen 0 0 0 0  Total GAD 7 Score _0 Anxiety Difficulty Not difficult at all Not difficult at all Not difficult at all Not difficult at all    Patient states she has been having some numbness and tingling in her right hand that happens daily.  She works with her hands at work and notices it when she is holding her phone in her hand.  Relevant past medical, surgical, family and social history reviewed and updated as indicated. Interim medical history since our last visit  reviewed. Allergies and medications reviewed and updated.  Review of Systems  Eyes:  Negative for visual disturbance.  Respiratory:  Negative for cough, chest tightness and shortness of breath.   Cardiovascular:  Negative for chest pain, palpitations and leg swelling.  Neurological:  Positive for numbness and headaches. Negative for dizziness.   Per HPI unless specifically indicated above     Objective:    BP 136/80   Pulse 74   Temp 98.1 F (36.7 C) (Oral)   Ht 5' 1.3" (1.557 m)   Wt 229 lb (103.9 kg)   LMP 10/01/2020 (Approximate)   SpO2 97%   BMI 42.85 kg/m   Wt Readings from Last 3 Encounters:  12/04/20 229 lb (103.9 kg)  09/03/20 231 lb (104.8 kg)  07/26/20 231 lb 3.2 oz (104.9 kg)    Physical Exam Vitals and nursing note reviewed.  Constitutional:      General: She is not in acute distress.    Appearance: Normal appearance. She is obese. She is not ill-appearing, toxic-appearing or diaphoretic.  HENT:     Head: Normocephalic.     Right Ear: External ear normal.     Left Ear: External ear normal.     Nose: Nose normal.     Mouth/Throat:     Mouth: Mucous membranes are moist.     Pharynx: Oropharynx is clear.  Eyes:  General:        Right eye: No discharge.        Left eye: No discharge.     Extraocular Movements: Extraocular movements intact.     Conjunctiva/sclera: Conjunctivae normal.     Pupils: Pupils are equal, round, and reactive to light.  Cardiovascular:     Rate and Rhythm: Normal rate and regular rhythm.     Heart sounds: No murmur heard. Pulmonary:     Effort: Pulmonary effort is normal. No respiratory distress.     Breath sounds: Normal breath sounds. No wheezing or rales.  Musculoskeletal:     Cervical back: Normal range of motion and neck supple.  Skin:    General: Skin is warm and dry.     Capillary Refill: Capillary refill takes less than 2 seconds.  Neurological:     General: No focal deficit present.     Mental Status: She is  alert and oriented to person, place, and time. Mental status is at baseline.  Psychiatric:        Mood and Affect: Mood normal.        Behavior: Behavior normal.        Thought Content: Thought content normal.        Judgment: Judgment normal.    Results for orders placed or performed in visit on 07/26/20  WET PREP FOR [place], YEAST, CLUE   Specimen: Sterile Swab   Sterile Swab  Result Value Ref Range   Trichomonas Exam Negative Negative   Yeast Exam Negative Negative   Clue Cell Exam Negative Negative  Urinalysis, Routine w reflex microscopic  Result Value Ref Range   Specific Gravity, UA >1.030 (H) 1.005 - 1.030   pH, UA 5.5 5.0 - 7.5   Color, UA Yellow Yellow   Appearance Ur Clear Clear   Leukocytes,UA Negative Negative   Protein,UA Negative Negative/Trace   Glucose, UA Negative Negative   Ketones, UA Negative Negative   RBC, UA Negative Negative   Bilirubin, UA Negative Negative   Urobilinogen, Ur 0.2 0.2 - 1.0 mg/dL   Nitrite, UA Negative Negative      Assessment & Plan:   Problem List Items Addressed This Visit       Cardiovascular and Mediastinum   Hypertension    Chronic.  Controlled.  Continue with current medication regimen of HCTZ 58m daily.  Labs ordered today.  Refills sent today.  Return to clinic in 3 months for reevaluation.  Call sooner if concerns arise.        Relevant Orders   Comp Met (CMET)     Other   Anxiety    Chronic.  Controlled.  Continue with current medication regimen of Buspar 552mBID PRN.  Labs ordered today.  Refills sent today.  Return to clinic in 3 months for reevaluation.  Call sooner if concerns arise.        Relevant Medications   busPIRone (BUSPAR) 5 MG tablet   BMI 40.0-44.9, adult (HCC) - Primary    Maintain a healthy lifestyle through diet and exercise.       Vitamin D deficiency    Labs ordered today.  Continue with Vitamin D supplement.  Follow up in 3 months for reevaluation.       Relevant Orders   Vitamin D  (25 hydroxy)   Mixed hyperlipidemia    Chronic.  Labs ordered today.  Will make recommendations based on lab results.  Return to clinic in 3 months for reevaluation.  Call sooner if concerns arise.        Relevant Orders   Lipid Profile   Other Visit Diagnoses     Numbness and tingling in right hand       Labs ordered today to evaluate B12 level. Recommend using a brace at night to help with symptoms. Will make recommendations based on lab results.    Relevant Orders   B12   Need for influenza vaccination       Relevant Orders   Flu Vaccine QUAD 32moIM (Fluarix, Fluzone & Alfiuria Quad PF) (Completed)        Follow up plan: Return in about 3 months (around 03/05/2021) for Physical and Fasting labs.

## 2020-12-04 ENCOUNTER — Ambulatory Visit: Payer: BC Managed Care – PPO | Admitting: Nurse Practitioner

## 2020-12-04 ENCOUNTER — Other Ambulatory Visit: Payer: Self-pay

## 2020-12-04 ENCOUNTER — Encounter: Payer: Self-pay | Admitting: Nurse Practitioner

## 2020-12-04 VITALS — BP 136/80 | HR 74 | Temp 98.1°F | Ht 61.3 in | Wt 229.0 lb

## 2020-12-04 DIAGNOSIS — Z23 Encounter for immunization: Secondary | ICD-10-CM | POA: Diagnosis not present

## 2020-12-04 DIAGNOSIS — Z6841 Body Mass Index (BMI) 40.0 and over, adult: Secondary | ICD-10-CM

## 2020-12-04 DIAGNOSIS — R202 Paresthesia of skin: Secondary | ICD-10-CM | POA: Diagnosis not present

## 2020-12-04 DIAGNOSIS — M5416 Radiculopathy, lumbar region: Secondary | ICD-10-CM | POA: Diagnosis not present

## 2020-12-04 DIAGNOSIS — F419 Anxiety disorder, unspecified: Secondary | ICD-10-CM

## 2020-12-04 DIAGNOSIS — E559 Vitamin D deficiency, unspecified: Secondary | ICD-10-CM

## 2020-12-04 DIAGNOSIS — E782 Mixed hyperlipidemia: Secondary | ICD-10-CM | POA: Diagnosis not present

## 2020-12-04 DIAGNOSIS — I10 Essential (primary) hypertension: Secondary | ICD-10-CM

## 2020-12-04 DIAGNOSIS — R200 Anesthesia of skin: Secondary | ICD-10-CM | POA: Diagnosis not present

## 2020-12-04 MED ORDER — BUSPIRONE HCL 5 MG PO TABS: 5.0000 mg | ORAL_TABLET | Freq: Two times a day (BID) | ORAL | 1 refills | Status: DC | PRN

## 2020-12-04 MED ORDER — PANTOPRAZOLE SODIUM 40 MG PO TBEC: 40.0000 mg | DELAYED_RELEASE_TABLET | Freq: Every day | ORAL | 1 refills | Status: DC | PRN

## 2020-12-04 NOTE — Assessment & Plan Note (Signed)
Chronic.  Controlled.  Continue with current medication regimen of Buspar 5mg  BID PRN.  Labs ordered today.  Refills sent today.  Return to clinic in 3 months for reevaluation.  Call sooner if concerns arise.

## 2020-12-04 NOTE — Assessment & Plan Note (Signed)
Chronic.  Controlled.  Continue with current medication regimen of HCTZ 25mg  daily.  Labs ordered today.  Refills sent today.  Return to clinic in 3 months for reevaluation.  Call sooner if concerns arise.

## 2020-12-04 NOTE — Assessment & Plan Note (Signed)
Labs ordered today.  Continue with Vitamin D supplement.  Follow up in 3 months for reevaluation.

## 2020-12-04 NOTE — Assessment & Plan Note (Signed)
Maintain a healthy lifestyle through diet and exercise.  

## 2020-12-04 NOTE — Assessment & Plan Note (Signed)
Chronic.  Labs ordered today.  Will make recommendations based on lab results.  Return to clinic in 3 months for reevaluation.  Call sooner if concerns arise.

## 2020-12-05 LAB — COMPREHENSIVE METABOLIC PANEL
ALT: 19 IU/L (ref 0–32)
AST: 14 IU/L (ref 0–40)
Albumin/Globulin Ratio: 1.6 (ref 1.2–2.2)
Albumin: 4.2 g/dL (ref 3.8–4.8)
Alkaline Phosphatase: 121 IU/L (ref 44–121)
BUN/Creatinine Ratio: 19 (ref 9–23)
BUN: 13 mg/dL (ref 6–24)
Bilirubin Total: 0.3 mg/dL (ref 0.0–1.2)
CO2: 28 mmol/L (ref 20–29)
Calcium: 8.8 mg/dL (ref 8.7–10.2)
Chloride: 100 mmol/L (ref 96–106)
Creatinine, Ser: 0.68 mg/dL (ref 0.57–1.00)
Globulin, Total: 2.6 g/dL (ref 1.5–4.5)
Glucose: 165 mg/dL — ABNORMAL HIGH (ref 65–99)
Potassium: 3.7 mmol/L (ref 3.5–5.2)
Sodium: 142 mmol/L (ref 134–144)
Total Protein: 6.8 g/dL (ref 6.0–8.5)
eGFR: 107 mL/min/{1.73_m2} (ref >59)

## 2020-12-05 LAB — LIPID PANEL
Chol/HDL Ratio: 5 ratio — ABNORMAL HIGH (ref 0.0–4.4)
Cholesterol, Total: 169 mg/dL (ref 100–199)
HDL: 34 mg/dL — ABNORMAL LOW (ref >39)
LDL Chol Calc (NIH): 99 mg/dL (ref 0–99)
Triglycerides: 210 mg/dL — ABNORMAL HIGH (ref 0–149)
VLDL Cholesterol Cal: 36 mg/dL (ref 5–40)

## 2020-12-05 LAB — VITAMIN B12: Vitamin B-12: 338 pg/mL (ref 232–1245)

## 2020-12-05 LAB — VITAMIN D 25 HYDROXY (VIT D DEFICIENCY, FRACTURES): Vit D, 25-Hydroxy: 40.8 ng/mL (ref 30.0–100.0)

## 2020-12-05 NOTE — Progress Notes (Signed)
Hi Kari Brandt.  Overall your blood work looks good. Your glucose is elevated, we should check an A1c to evaluate for diabetes at your next visit. Vitamin D and B12 are within normal limits. Cholesterol has improved but still elevated.  Recommend following a low fat diet and exercise.  We will recheck your lab work at next visit.

## 2020-12-14 IMAGING — MG DIGITAL SCREENING BILAT W/ TOMO W/ CAD
8 series · 8 of 24 positions shown · non-contrast
Comparison: None.

CLINICAL DATA: Screening.

EXAM:
DIGITAL SCREENING BILATERAL MAMMOGRAM WITH TOMO AND CAD

[R MLO synth-2D]
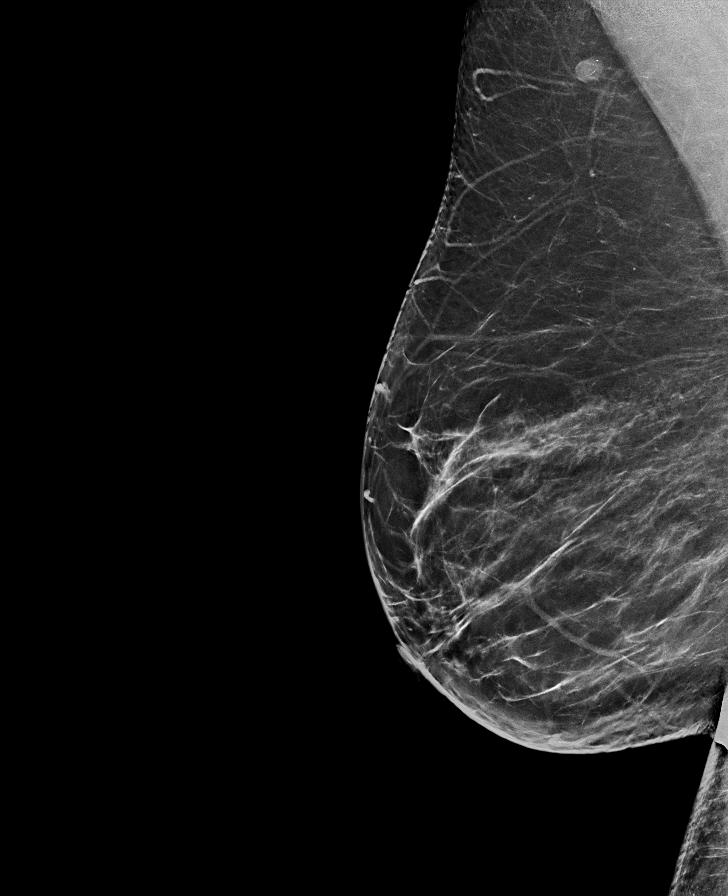

[L CC synth-2D]
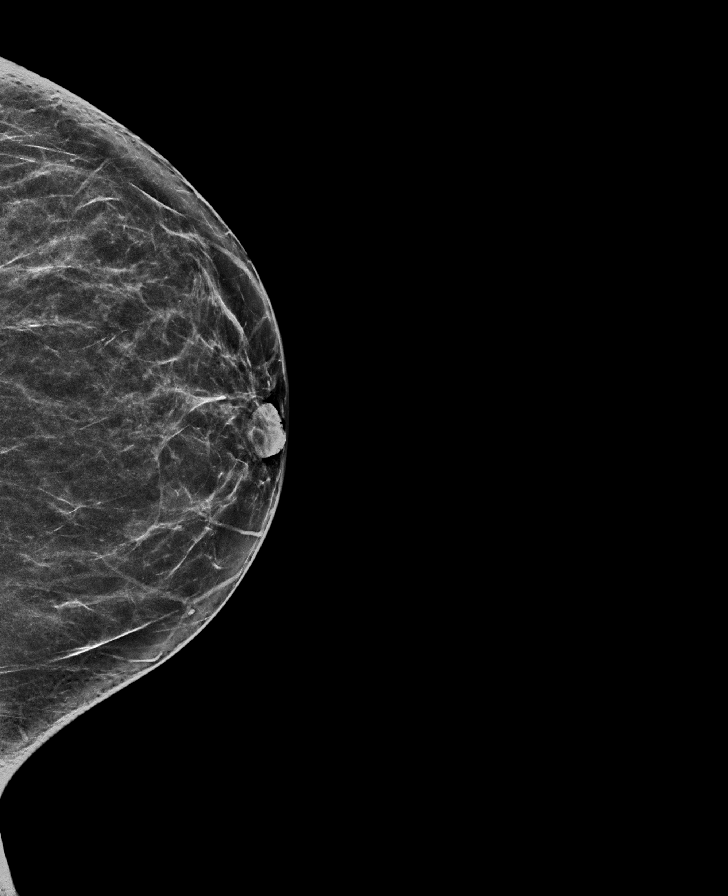

[L MLO synth-2D]
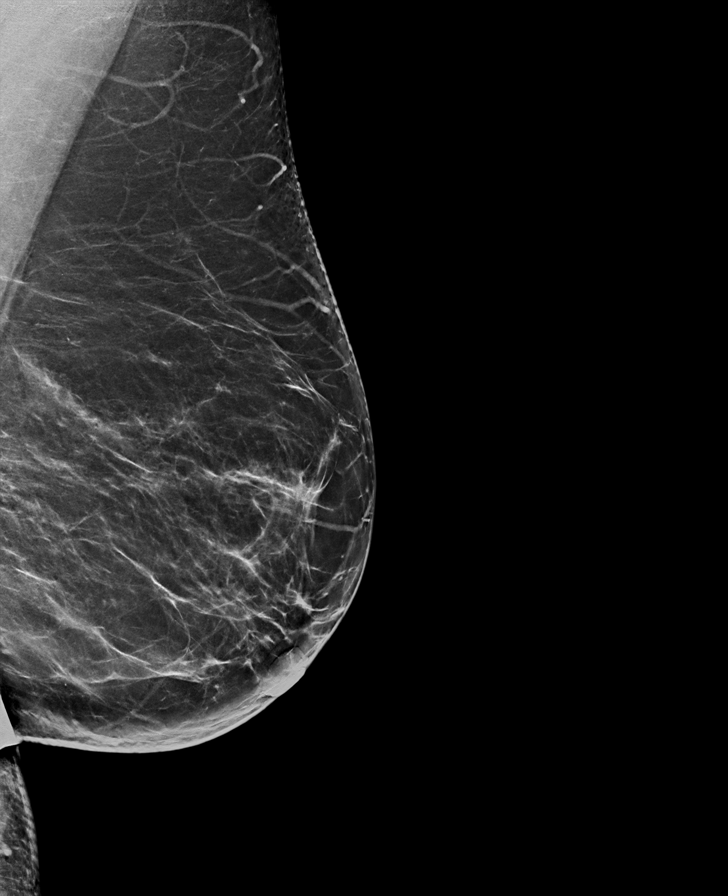

[R CC synth-2D]
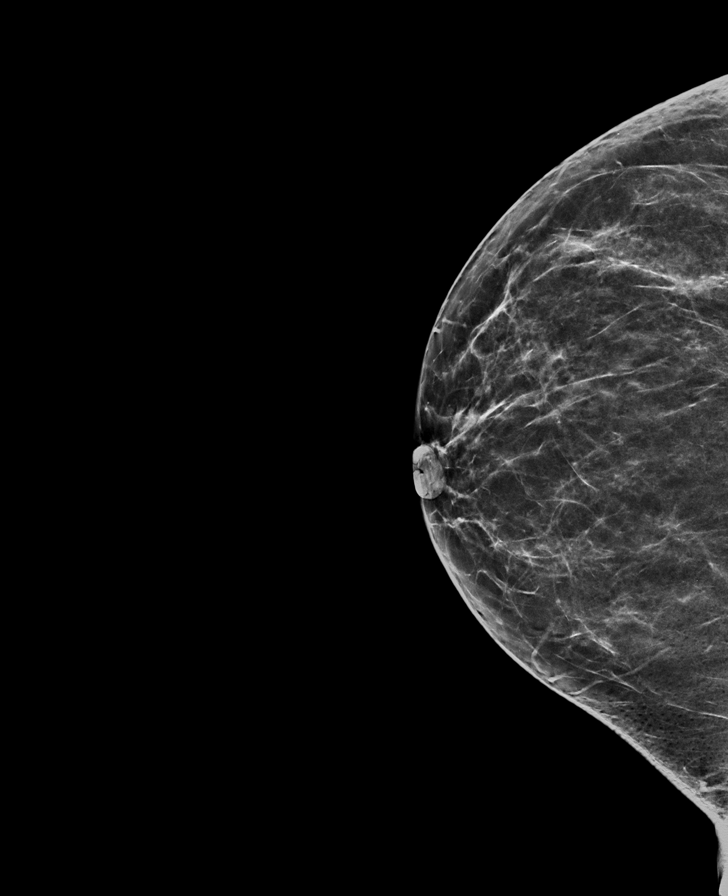

[R CC tomo · tomo slice 35/69.0]
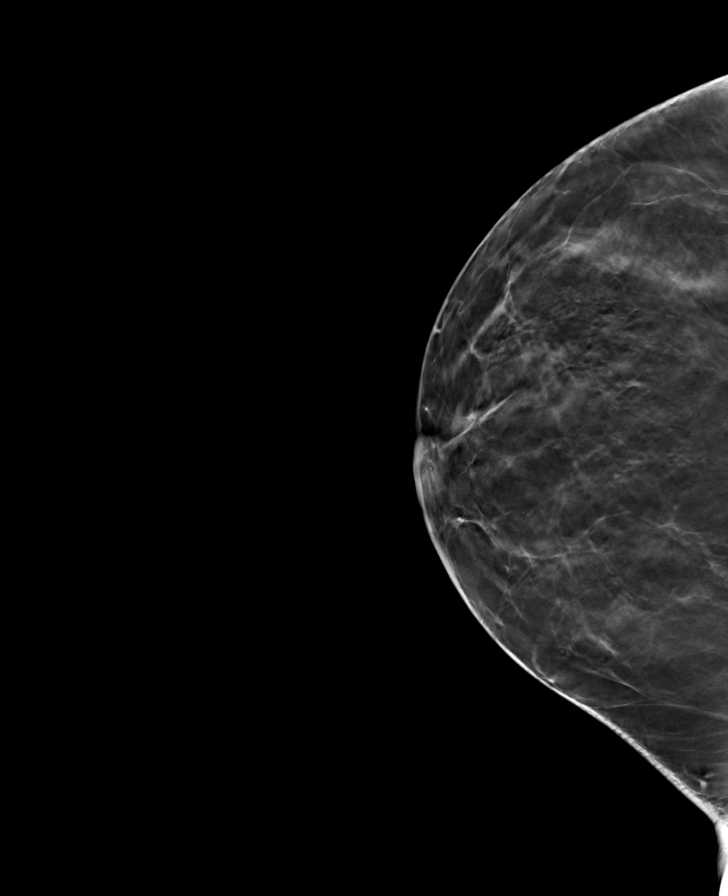

[L CC tomo · tomo slice 35/69.0]
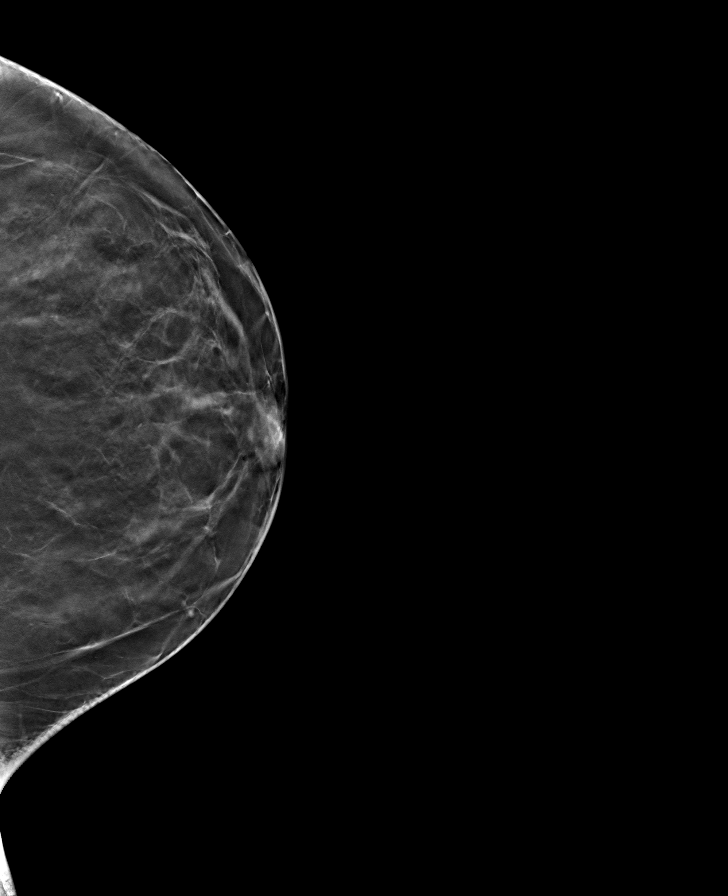

[R MLO tomo · tomo slice 41/82.0]
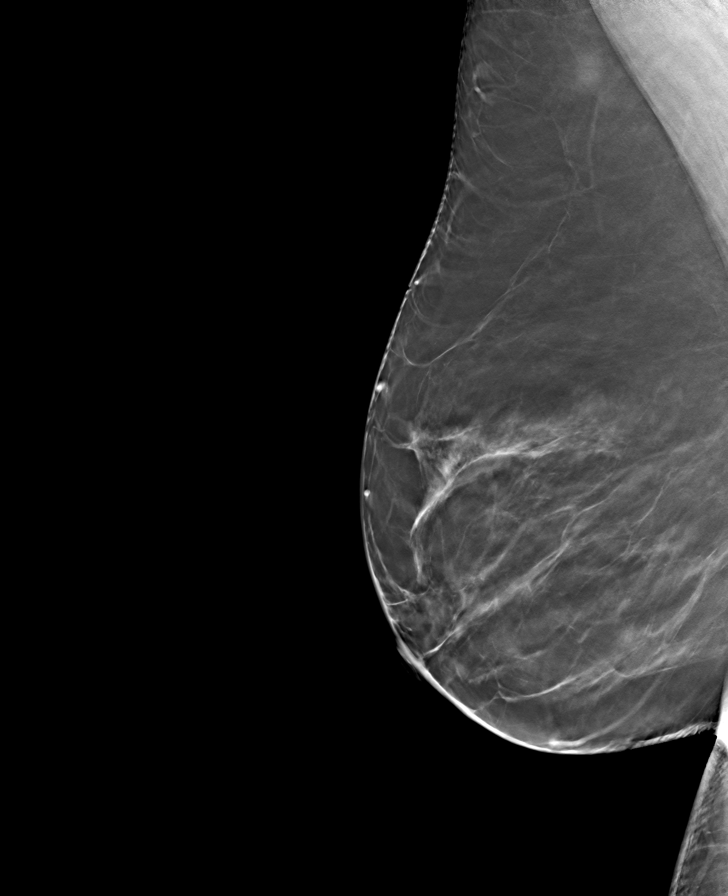

[L MLO tomo · tomo slice 43/86.0]
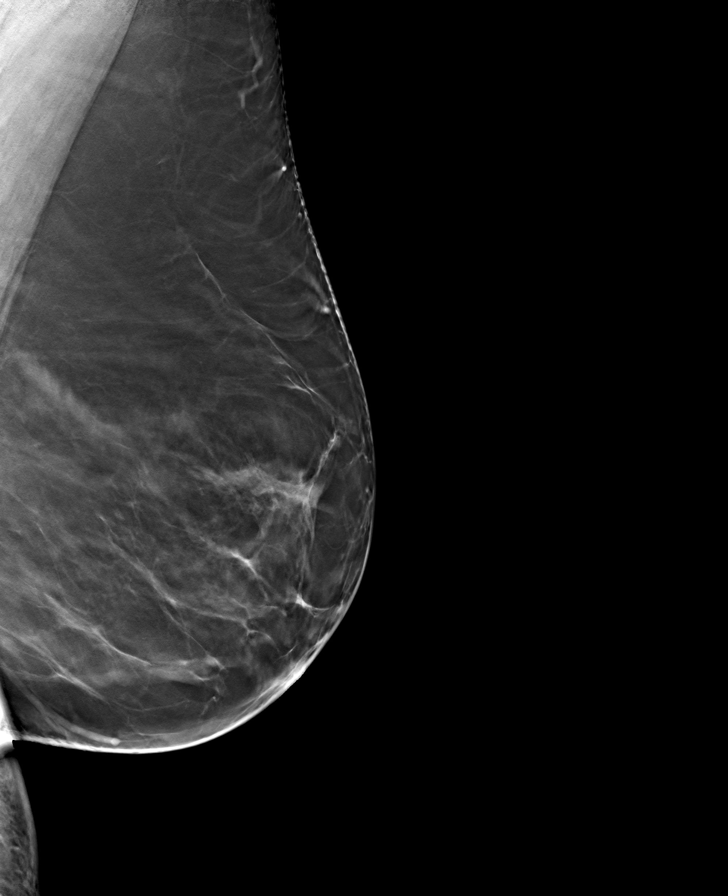

[8 of 24 positions shown; findings below may reference images not displayed]

ACR Breast Density Category b: There are scattered areas of
fibroglandular density.
FINDINGS: In the right axilla, a possible mass warrants further evaluation. In
the left breast, no findings suspicious for malignancy.

Images were processed with CAD.
IMPRESSION: Further evaluation is suggested for possible mass in the right
axilla.

RECOMMENDATION:
Ultrasound of the right axilla. (Code:[id number])

The patient will be contacted regarding the findings, and additional
imaging will be scheduled.

BI-RADS CATEGORY  0: Incomplete. Need additional imaging evaluation
and/or prior mammograms for comparison.

## 2021-03-04 NOTE — Progress Notes (Signed)
BP (!) 164/85 (BP Location: Right Arm, Cuff Size: Large)    Pulse 68    Temp 98.5 F (36.9 C) (Oral)    Ht 5' 1.2" (1.554 m)    Wt 223 lb 12.8 oz (101.5 kg)    LMP 02/20/2021 (Exact Date)    SpO2 97%    BMI 42.01 kg/m    Subjective:    Patient ID: Kari Brandt, female    DOB: 12/02/1970, 50 y.o.   MRN: [id number]  HPI: Kari Brandt is a 50 y.o. female presenting on 03/05/2021 for comprehensive medical examination. Current medical complaints include:none  She currently lives with: Menopausal Symptoms: yes  HYPERTENSION / HYPERLIPIDEMIA Satisfied with current treatment? no Duration of hypertension: years BP monitoring frequency: not checking BP range:  BP medication side effects: no Past BP meds: HCTZ Duration of hyperlipidemia: years Cholesterol medication side effects: no Cholesterol supplements: none Past cholesterol medications: none Medication compliance: excellent compliance Aspirin: no Recent stressors: no Recurrent headaches: no Visual changes: no Palpitations: no Dyspnea: no Chest pain: no Lower extremity edema: no Dizzy/lightheaded: no  ANXIETY Patient feels like her anxiety is about the same.  She is using the Buspar and feels like it is working.  Would like to continue with that dose.   GAD 7 : Generalized Anxiety Score 03/05/2021 12/04/2020 09/03/2020 07/26/2020  Nervous, Anxious, on Edge 0 1 0 0  Control/stop worrying 1 0 2 0  Worry too much - different things 1 0 0 0  Trouble relaxing 0 1 0 0  Restless 1 1 0 1  Easily annoyed or irritable 1 0 0 0  Afraid - awful might happen 0 0 0 0  Total GAD 7 Score '4 3 2 1  ' Anxiety Difficulty Somewhat difficult Not difficult at all Not difficult at all Not difficult at all     Depression Screen done today and results listed below:  Depression screen [facility] 2/9 03/05/2021 12/04/2020 09/03/2020 07/26/2020 02/07/2020  Decreased Interest 1 0 0 0 0  Down, Depressed, Hopeless 1 0 0 2 0  PHQ - 2 Score 2 0 0 2 0   Altered sleeping '1 2 3 2 ' -  Tired, decreased energy '2 1 3 ' 0 -  Change in appetite 0 0 0 0 -  Feeling bad or failure about yourself  0 0 0 0 -  Trouble concentrating 0 0 0 0 -  Moving slowly or fidgety/restless 0 0 0 0 -  Suicidal thoughts 0 0 0 0 -  PHQ-9 Score '5 3 6 4 ' -  Difficult doing work/chores Somewhat difficult Not difficult at all Not difficult at all Not difficult at all -    The patient does not have a history of falls. I did complete a risk assessment for falls. A plan of care for falls was documented.   Past Medical History:  Past Medical History:  Diagnosis Date   Anxiety    GERD (gastroesophageal reflux disease)    Hypertension     Surgical History:  Past Surgical History:  Procedure Laterality Date   ENDOMETRIAL ABLATION  2007   her option   TUBAL LIGATION     WRIST SURGERY      Medications:  Current Outpatient Medications on File Prior to Visit  Medication Sig   Ascorbic Acid (VITAMIN C) 1000 MG tablet Take 1,000 mg by mouth daily.   ASPIRIN 81 PO Take 1 tablet by mouth daily.   busPIRone (BUSPAR) 5 MG tablet Take 1 tablet (  5 mg total) by mouth 2 (two) times daily as needed (anxiety, panic).   pantoprazole (PROTONIX) 40 MG tablet Take 1 tablet (40 mg total) by mouth daily as needed.   VITAMIN D PO Take by mouth.   No current facility-administered medications on file prior to visit.    Allergies:  No Known Allergies  Social History:  Social History   Socioeconomic History   Marital status: Married    Spouse name: Not on file   Number of children: Not on file   Years of education: Not on file   Highest education level: Not on file  Occupational History   Not on file  Tobacco Use   Smoking status: Never   Smokeless tobacco: Never  Vaping Use   Vaping Use: Never used  Substance and Sexual Activity   Alcohol use: Yes    Comment: occassionally   Drug use: Never   Sexual activity: Yes    Birth control/protection: None, Surgical     Comment: Tubal Ligation  Other Topics Concern   Not on file  Social History Narrative   Not on file   Social Determinants of Health   Financial Resource Strain: Not on file  Food Insecurity: Not on file  Transportation Needs: Not on file  Physical Activity: Not on file  Stress: Not on file  Social Connections: Not on file  Intimate Partner Violence: Not on file   Social History   Tobacco Use  Smoking Status Never  Smokeless Tobacco Never   Social History   Substance and Sexual Activity  Alcohol Use Yes   Comment: occassionally    Family History:  Family History  Problem Relation Age of Onset   Diabetes Mother    Heart disease Mother    Kidney disease Mother    Anxiety disorder Sister    Heart disease Maternal Grandmother    Breast cancer Neg Hx     Past medical history, surgical history, medications, allergies, family history and social history reviewed with patient today and changes made to appropriate areas of the chart.   Review of Systems  Eyes:  Negative for blurred vision and double vision.  Respiratory:  Negative for shortness of breath.   Cardiovascular:  Negative for chest pain, palpitations and leg swelling.  Neurological:  Negative for dizziness and headaches.  Psychiatric/Behavioral:  The patient is nervous/anxious.   All other ROS negative except what is listed above and in the HPI.      Objective:    BP (!) 164/85 (BP Location: Right Arm, Cuff Size: Large)    Pulse 68    Temp 98.5 F (36.9 C) (Oral)    Ht 5' 1.2" (1.554 m)    Wt 223 lb 12.8 oz (101.5 kg)    LMP 02/20/2021 (Exact Date)    SpO2 97%    BMI 42.01 kg/m   Wt Readings from Last 3 Encounters:  03/05/21 223 lb 12.8 oz (101.5 kg)  12/04/20 229 lb (103.9 kg)  09/03/20 231 lb (104.8 kg)    Physical Exam Vitals and nursing note reviewed. Exam conducted with a chaperone present Yvonna Alanis, [place]).  Constitutional:      General: She is awake. She is not in acute distress.     Appearance: She is well-developed. She is obese. She is not ill-appearing.  HENT:     Head: Normocephalic and atraumatic.     Right Ear: Hearing, tympanic membrane, ear canal and external ear normal. No drainage.     Left  Ear: Hearing, tympanic membrane, ear canal and external ear normal. No drainage.     Nose: Nose normal.     Right Sinus: No maxillary sinus tenderness or frontal sinus tenderness.     Left Sinus: No maxillary sinus tenderness or frontal sinus tenderness.     Mouth/Throat:     Mouth: Mucous membranes are moist.     Pharynx: Oropharynx is clear. Uvula midline. No pharyngeal swelling, oropharyngeal exudate or posterior oropharyngeal erythema.  Eyes:     General: Lids are normal.        Right eye: No discharge.        Left eye: No discharge.     Extraocular Movements: Extraocular movements intact.     Conjunctiva/sclera: Conjunctivae normal.     Pupils: Pupils are equal, round, and reactive to light.     Visual Fields: Right eye visual fields normal and left eye visual fields normal.  Neck:     Thyroid: No thyromegaly.     Vascular: No carotid bruit.     Trachea: Trachea normal.  Cardiovascular:     Rate and Rhythm: Normal rate and regular rhythm.     Heart sounds: Normal heart sounds. No murmur heard.   No gallop.  Pulmonary:     Effort: Pulmonary effort is normal. No accessory muscle usage or respiratory distress.     Breath sounds: Normal breath sounds.  Chest:  Breasts:    Right: Normal.     Left: Normal.  Abdominal:     General: Bowel sounds are normal.     Palpations: Abdomen is soft. There is no hepatomegaly or splenomegaly.     Tenderness: There is no abdominal tenderness.  Genitourinary:    Vagina: Vaginal discharge present.     Cervix: Normal.     Adnexa: Right adnexa normal and left adnexa normal.  Musculoskeletal:        General: Normal range of motion.     Cervical back: Normal range of motion and neck supple.     Right lower leg: No edema.      Left lower leg: No edema.  Lymphadenopathy:     Head:     Right side of head: No submental, submandibular, tonsillar, preauricular or posterior auricular adenopathy.     Left side of head: No submental, submandibular, tonsillar, preauricular or posterior auricular adenopathy.     Cervical: No cervical adenopathy.     Upper Body:     Right upper body: No supraclavicular, axillary or pectoral adenopathy.     Left upper body: No supraclavicular, axillary or pectoral adenopathy.  Skin:    General: Skin is warm and dry.     Capillary Refill: Capillary refill takes less than 2 seconds.     Findings: No rash.  Neurological:     Mental Status: She is alert and oriented to person, place, and time.     Gait: Gait is intact.     Deep Tendon Reflexes: Reflexes are normal and symmetric.     Reflex Scores:      Brachioradialis reflexes are 2+ on the right side and 2+ on the left side.      Patellar reflexes are 2+ on the right side and 2+ on the left side. Psychiatric:        Attention and Perception: Attention normal.        Mood and Affect: Mood normal.        Speech: Speech normal.        Behavior: Behavior  normal. Behavior is cooperative.        Thought Content: Thought content normal.        Judgment: Judgment normal.    Results for orders placed or performed in visit on 12/04/20  Comp Met (CMET)  Result Value Ref Range   Glucose 165 (H) 65 - 99 mg/dL   BUN 13 6 - 24 mg/dL   Creatinine, Ser 0.68 0.57 - 1.00 mg/dL   eGFR 107 >59 mL/min/1.73   BUN/Creatinine Ratio 19 9 - 23   Sodium 142 134 - 144 mmol/L   Potassium 3.7 3.5 - 5.2 mmol/L   Chloride 100 96 - 106 mmol/L   CO2 28 20 - 29 mmol/L   Calcium 8.8 8.7 - 10.2 mg/dL   Total Protein 6.8 6.0 - 8.5 g/dL   Albumin 4.2 3.8 - 4.8 g/dL   Globulin, Total 2.6 1.5 - 4.5 g/dL   Albumin/Globulin Ratio 1.6 1.2 - 2.2   Bilirubin Total 0.3 0.0 - 1.2 mg/dL   Alkaline Phosphatase 121 44 - 121 IU/L   AST 14 0 - 40 IU/L   ALT 19 0 - 32 IU/L   Lipid Profile  Result Value Ref Range   Cholesterol, Total 169 100 - 199 mg/dL   Triglycerides 210 (H) 0 - 149 mg/dL   HDL 34 (L) >39 mg/dL   VLDL Cholesterol Cal 36 5 - 40 mg/dL   LDL Chol Calc (NIH) 99 0 - 99 mg/dL   Chol/HDL Ratio 5.0 (H) 0.0 - 4.4 ratio  Vitamin D (25 hydroxy)  Result Value Ref Range   Vit D, 25-Hydroxy 40.8 30.0 - 100.0 ng/mL  B12  Result Value Ref Range   Vitamin B-12 338 232 - 1,245 pg/mL      Assessment & Plan:   Problem List Items Addressed This Visit       Cardiovascular and Mediastinum   Hypertension    Chronic. Not well controlled. Currently on HCTZ 74m. Will add Lisinopril 238mdaily.  Side effects and benefits of medication discussed during visit. Labs ordered today. Follow up in 1 month.       Relevant Medications   lisinopril (ZESTRIL) 20 MG tablet   hydrochlorothiazide (HYDRODIURIL) 25 MG tablet     Other   Anxiety    Chronic.  Controlled.  Continue with current medication regimen on Buspar 60m51mID.  Labs ordered today.  Return to clinic in 6 months for reevaluation.  Patient does not want to add medication at this time.  Call sooner if concerns arise.       BMI 40.0-44.9, adult [place]  Recommend a diet low in fat and exercise 5x weekly for at least 30 minutes.        Vitamin D deficiency    Labs ordered today. Will make recommendations based on lab results.       Relevant Orders   Vitamin D (25 hydroxy)   Mixed hyperlipidemia    Labs ordered today. Will make recommendations based on lab results.       Relevant Medications   lisinopril (ZESTRIL) 20 MG tablet   hydrochlorothiazide (HYDRODIURIL) 25 MG tablet   Other Visit Diagnoses     Annual physical exam    -  Primary   Health maintenance reviewed during visit.  Labs ordered today. Mammogram and Colonoscopy.   Relevant Orders   CBC with Differential/Platelet   Comprehensive metabolic panel   Lipid panel   TSH   Urinalysis, Routine w reflex microscopic  Cytology -  PAP   Missed periods       Patient requesting labs for hormones. Due to missing period for 4 months. Labs ordered today. WIll make recommendations based on lab results.    Relevant Orders   FSH/LH   Estradiol   Estrogens, total   Screening for cervical cancer       PAP obtained in normal fashion. Patient tolerated obtaining of specimen without complication.    Relevant Orders   Cytology - PAP   Encounter for screening mammogram for malignant neoplasm of breast       Relevant Orders   MM Digital Screening   Screening for colon cancer       Relevant Orders   Ambulatory referral to Gastroenterology        Follow up plan: Return in about 1 month (around 04/05/2021) for BP Check.   LABORATORY TESTING:  - Pap smear: pap done  IMMUNIZATIONS:   - Tdap: Tetanus vaccination status reviewed: last tetanus booster within 10 years. - Influenza: Up to date - Pneumovax: Not applicable - Prevnar: Not applicable - COVID: Refused - HPV: Not applicable - Shingrix vaccine: Not applicable  SCREENING: -Mammogram: Ordered today  - Colonoscopy: Ordered today  - Bone Density: Not applicable  -Hearing Test: Not applicable  -Spirometry: Not applicable   PATIENT COUNSELING:   Advised to take 1 mg of folate supplement per day if capable of pregnancy.   Sexuality: Discussed sexually transmitted diseases, partner selection, use of condoms, avoidance of unintended pregnancy  and contraceptive alternatives.   Advised to avoid cigarette smoking.  I discussed with the patient that most people either abstain from alcohol or drink within safe limits (<=14/week and <=4 drinks/occasion for males, <=7/weeks and <= 3 drinks/occasion for females) and that the risk for alcohol disorders and other health effects rises proportionally with the number of drinks per week and how often a drinker exceeds daily limits.  Discussed cessation/primary prevention of drug use and availability of treatment for abuse.    Diet: Encouraged to adjust caloric intake to maintain  or achieve ideal body weight, to reduce intake of dietary saturated fat and total fat, to limit sodium intake by avoiding high sodium foods and not adding table salt, and to maintain adequate dietary potassium and calcium preferably from fresh fruits, vegetables, and low-fat dairy products.    stressed the importance of regular exercise  Injury prevention: Discussed safety belts, safety helmets, smoke detector, smoking near bedding or upholstery.   Dental health: Discussed importance of regular tooth brushing, flossing, and dental visits.    NEXT PREVENTATIVE PHYSICAL DUE IN 1 YEAR. Return in about 1 month (around 04/05/2021) for BP Check.

## 2021-03-05 ENCOUNTER — Encounter: Payer: Self-pay | Admitting: Nurse Practitioner

## 2021-03-05 ENCOUNTER — Other Ambulatory Visit: Payer: Self-pay

## 2021-03-05 ENCOUNTER — Other Ambulatory Visit (HOSPITAL_COMMUNITY)
Admission: RE | Admit: 2021-03-05 | Discharge: 2021-03-05 | Disposition: A | Discharge status: Home / Self Care | Payer: BC Managed Care – PPO | Source: Ambulatory Visit | Attending: Nurse Practitioner | Admitting: Nurse Practitioner

## 2021-03-05 ENCOUNTER — Ambulatory Visit (INDEPENDENT_AMBULATORY_CARE_PROVIDER_SITE_OTHER): Payer: BC Managed Care – PPO | Admitting: Nurse Practitioner

## 2021-03-05 VITALS — BP 164/85 | HR 68 | Temp 98.5°F | Ht 61.2 in | Wt 223.8 lb

## 2021-03-05 DIAGNOSIS — Z1211 Encounter for screening for malignant neoplasm of colon: Secondary | ICD-10-CM

## 2021-03-05 DIAGNOSIS — Z0000 Encounter for general adult medical examination without abnormal findings: Secondary | ICD-10-CM | POA: Insufficient documentation

## 2021-03-05 DIAGNOSIS — E559 Vitamin D deficiency, unspecified: Secondary | ICD-10-CM

## 2021-03-05 DIAGNOSIS — Z1231 Encounter for screening mammogram for malignant neoplasm of breast: Secondary | ICD-10-CM

## 2021-03-05 DIAGNOSIS — F419 Anxiety disorder, unspecified: Secondary | ICD-10-CM

## 2021-03-05 DIAGNOSIS — Z136 Encounter for screening for cardiovascular disorders: Secondary | ICD-10-CM | POA: Diagnosis not present

## 2021-03-05 DIAGNOSIS — Z6841 Body Mass Index (BMI) 40.0 and over, adult: Secondary | ICD-10-CM | POA: Diagnosis not present

## 2021-03-05 DIAGNOSIS — Z124 Encounter for screening for malignant neoplasm of cervix: Secondary | ICD-10-CM

## 2021-03-05 DIAGNOSIS — I10 Essential (primary) hypertension: Secondary | ICD-10-CM | POA: Diagnosis not present

## 2021-03-05 DIAGNOSIS — N926 Irregular menstruation, unspecified: Secondary | ICD-10-CM

## 2021-03-05 DIAGNOSIS — E782 Mixed hyperlipidemia: Secondary | ICD-10-CM

## 2021-03-05 MED ORDER — HYDROCHLOROTHIAZIDE 25 MG PO TABS: 25.0000 mg | ORAL_TABLET | Freq: Every day | ORAL | 1 refills | Status: DC

## 2021-03-05 MED ORDER — LISINOPRIL 20 MG PO TABS: 20.0000 mg | ORAL_TABLET | Freq: Every day | ORAL | 0 refills | Status: DC

## 2021-03-05 NOTE — Assessment & Plan Note (Signed)
Labs ordered today.  Will make recommendations based on lab results. ?

## 2021-03-05 NOTE — Patient Instructions (Signed)
Please call to schedule your mammogram and/or bone density: °[facility]  °Address: [address], [place], [place] [address]  °Phone: [phone] ° °

## 2021-03-05 NOTE — Assessment & Plan Note (Addendum)
Chronic. Not well controlled. Currently on HCTZ 25mg . Will add Lisinopril 20mg  daily.  Side effects and benefits of medication discussed during visit. Labs ordered today. Follow up in 1 month.

## 2021-03-05 NOTE — Assessment & Plan Note (Signed)
Recommend a diet low in fat and exercise 5x weekly for at least 30 minutes.

## 2021-03-05 NOTE — Assessment & Plan Note (Addendum)
Chronic.  Controlled.  Continue with current medication regimen on Buspar 5mg  BID.  Labs ordered today.  Return to clinic in 6 months for reevaluation.  Patient does not want to add medication at this time.  Call sooner if concerns arise.

## 2021-03-06 LAB — URINALYSIS, ROUTINE W REFLEX MICROSCOPIC
Bilirubin, UA: NEGATIVE
Glucose, UA: NEGATIVE
Ketones, UA: NEGATIVE
Leukocytes,UA: NEGATIVE
Nitrite, UA: NEGATIVE
Protein,UA: NEGATIVE
RBC, UA: NEGATIVE
Specific Gravity, UA: 1.017 (ref 1.005–1.030)
Urobilinogen, Ur: 0.2 mg/dL (ref 0.2–1.0)
pH, UA: 5 (ref 5.0–7.5)

## 2021-03-06 NOTE — Progress Notes (Signed)
Hi Kari Brandt.  Overall your lab work looks good.  Your cholesterol is elevated.  Recommend following a low fat diet. Your Hormones show that you are menopausal as expected.  Please let me know if you have any questions. I will see you at our next visit.

## 2021-03-07 LAB — CYTOLOGY - PAP: Diagnosis: NEGATIVE

## 2021-03-07 NOTE — Progress Notes (Signed)
Hi Becky.  Your PAP was normal.  We will repeat it in 3 years.

## 2021-03-08 LAB — LIPID PANEL
Chol/HDL Ratio: 4.5 ratio — ABNORMAL HIGH (ref 0.0–4.4)
Cholesterol, Total: 176 mg/dL (ref 100–199)
HDL: 39 mg/dL — ABNORMAL LOW (ref >39)
LDL Chol Calc (NIH): 113 mg/dL — ABNORMAL HIGH (ref 0–99)
Triglycerides: 136 mg/dL (ref 0–149)
VLDL Cholesterol Cal: 24 mg/dL (ref 5–40)

## 2021-03-08 LAB — CBC WITH DIFFERENTIAL/PLATELET
Basophils Absolute: 0 10*3/uL (ref 0.0–0.2)
Basos: 1 %
EOS (ABSOLUTE): 0.2 10*3/uL (ref 0.0–0.4)
Eos: 2 %
Hematocrit: 39.7 % (ref 34.0–46.6)
Hemoglobin: 12.9 g/dL (ref 11.1–15.9)
Immature Grans (Abs): 0 10*3/uL (ref 0.0–0.1)
Immature Granulocytes: 0 %
Lymphocytes Absolute: 2.1 10*3/uL (ref 0.7–3.1)
Lymphs: 26 %
MCH: 28.7 pg (ref 26.6–33.0)
MCHC: 32.5 g/dL (ref 31.5–35.7)
MCV: 88 fL (ref 79–97)
Monocytes Absolute: 0.5 10*3/uL (ref 0.1–0.9)
Monocytes: 6 %
Neutrophils Absolute: 5.1 10*3/uL (ref 1.4–7.0)
Neutrophils: 65 %
Platelets: 367 10*3/uL (ref 150–450)
RBC: 4.49 x10E6/uL (ref 3.77–5.28)
RDW: 13 % (ref 11.7–15.4)
WBC: 7.9 10*3/uL (ref 3.4–10.8)

## 2021-03-08 LAB — FSH/LH
FSH: 19.4 m[IU]/mL
LH: 14.9 m[IU]/mL

## 2021-03-08 LAB — COMPREHENSIVE METABOLIC PANEL
ALT: 21 IU/L (ref 0–32)
AST: 19 IU/L (ref 0–40)
Albumin/Globulin Ratio: 1.8 (ref 1.2–2.2)
Albumin: 4.7 g/dL (ref 3.8–4.8)
Alkaline Phosphatase: 142 IU/L — ABNORMAL HIGH (ref 44–121)
BUN/Creatinine Ratio: 20 (ref 9–23)
BUN: 16 mg/dL (ref 6–24)
Bilirubin Total: 0.3 mg/dL (ref 0.0–1.2)
CO2: 26 mmol/L (ref 20–29)
Calcium: 9.6 mg/dL (ref 8.7–10.2)
Chloride: 103 mmol/L (ref 96–106)
Creatinine, Ser: 0.79 mg/dL (ref 0.57–1.00)
Globulin, Total: 2.6 g/dL (ref 1.5–4.5)
Glucose: 97 mg/dL (ref 70–99)
Potassium: 4.8 mmol/L (ref 3.5–5.2)
Sodium: 144 mmol/L (ref 134–144)
Total Protein: 7.3 g/dL (ref 6.0–8.5)
eGFR: 92 mL/min/{1.73_m2} (ref >59)

## 2021-03-08 LAB — ESTROGENS, TOTAL: Estrogen: 131 pg/mL

## 2021-03-08 LAB — VITAMIN D 25 HYDROXY (VIT D DEFICIENCY, FRACTURES): Vit D, 25-Hydroxy: 44.3 ng/mL (ref 30.0–100.0)

## 2021-03-08 LAB — ESTRADIOL: Estradiol: 24.3 pg/mL

## 2021-03-08 LAB — TSH: TSH: 2.01 u[IU]/mL (ref 0.450–4.500)

## 2021-03-10 NOTE — Progress Notes (Signed)
Hi Kari Brandt.  Your hormones show that you are going through menopause as expected. Please let me know if you have any questions.

## 2021-03-18 ENCOUNTER — Telehealth: Payer: Self-pay

## 2021-03-18 NOTE — Telephone Encounter (Signed)
CALLED PATIENT NO ANSWER LEFT VOICEMAIL FOR A CALL BACK ? ?

## 2021-04-04 NOTE — Progress Notes (Signed)
BP 103/68    Pulse 70    Temp 98.3 F (36.8 C) (Oral)    Wt 221 lb 9.6 oz (100.5 kg)    LMP 02/20/2021 (Approximate)    SpO2 98%    BMI 41.60 kg/m    Subjective:    Patient ID: Kari Brandt, female    DOB: [birth year], 51 y.o.   MRN: [id number]  HPI: Kari Brandt is a 51 y.o. female  Chief Complaint  Patient presents with   Hypertension   HYPERTENSION Hypertension status: controlled  Satisfied with current treatment? no Duration of hypertension: years BP monitoring frequency:  not checking BP range:  BP medication side effects:  no Medication compliance: excellent compliance Previous BP meds:lisinopril Aspirin: no Recurrent headaches: no Visual changes: no Palpitations: no Dyspnea: no Chest pain: no Lower extremity edema: no Dizzy/lightheaded: no  Patient states she is having some left foot pain. Got insoles to put in her shoes but the pain did not go away. Pain has bene going on for a couple of months.  If she sits down for a long period of time then gets up it is worse.  The pain is also worse in the mornings.   Relevant past medical, surgical, family and social history reviewed and updated as indicated. Interim medical history since our last visit reviewed. Allergies and medications reviewed and updated.  Review of Systems  Eyes:  Negative for visual disturbance.  Respiratory:  Negative for cough, chest tightness and shortness of breath.   Cardiovascular:  Negative for chest pain, palpitations and leg swelling.  Musculoskeletal:        Left foot pain  Neurological:  Negative for dizziness and headaches.   Per HPI unless specifically indicated above     Objective:    BP 103/68    Pulse 70    Temp 98.3 F (36.8 C) (Oral)    Wt 221 lb 9.6 oz (100.5 kg)    LMP 02/20/2021 (Approximate)    SpO2 98%    BMI 41.60 kg/m   Wt Readings from Last 3 Encounters:  04/07/21 221 lb 9.6 oz (100.5 kg)  03/05/21 223 lb 12.8 oz (101.5 kg)  12/04/20 229 lb (103.9  kg)    Physical Exam Vitals and nursing note reviewed.  Constitutional:      General: She is not in acute distress.    Appearance: Normal appearance. She is obese. She is not ill-appearing, toxic-appearing or diaphoretic.  HENT:     Head: Normocephalic.     Right Ear: External ear normal.     Left Ear: External ear normal.     Nose: Nose normal.     Mouth/Throat:     Mouth: Mucous membranes are moist.     Pharynx: Oropharynx is clear.  Eyes:     General:        Right eye: No discharge.        Left eye: No discharge.     Extraocular Movements: Extraocular movements intact.     Conjunctiva/sclera: Conjunctivae normal.     Pupils: Pupils are equal, round, and reactive to light.  Cardiovascular:     Rate and Rhythm: Normal rate and regular rhythm.     Heart sounds: No murmur heard. Pulmonary:     Effort: Pulmonary effort is normal. No respiratory distress.     Breath sounds: Normal breath sounds. No wheezing or rales.  Musculoskeletal:     Cervical back: Normal range of motion and neck supple.  Skin:  General: Skin is warm and dry.     Capillary Refill: Capillary refill takes less than 2 seconds.  Neurological:     General: No focal deficit present.     Mental Status: She is alert and oriented to person, place, and time. Mental status is at baseline.  Psychiatric:        Mood and Affect: Mood normal.        Behavior: Behavior normal.        Thought Content: Thought content normal.        Judgment: Judgment normal.    Results for orders placed or performed in visit on 03/05/21  CBC with Differential/Platelet  Result Value Ref Range   WBC 7.9 3.4 - 10.8 x10E3/uL   RBC 4.49 3.77 - 5.28 x10E6/uL   Hemoglobin 12.9 11.1 - 15.9 g/dL   Hematocrit 39.7 34.0 - 46.6 %   MCV 88 79 - 97 fL   MCH 28.7 26.6 - 33.0 pg   MCHC 32.5 31.5 - 35.7 g/dL   RDW 13.0 11.7 - 15.4 %   Platelets 367 150 - 450 x10E3/uL   Neutrophils 65 Not Estab. %   Lymphs 26 Not Estab. %   Monocytes 6 Not  Estab. %   Eos 2 Not Estab. %   Basos 1 Not Estab. %   Neutrophils Absolute 5.1 1.4 - 7.0 x10E3/uL   Lymphocytes Absolute 2.1 0.7 - 3.1 x10E3/uL   Monocytes Absolute 0.5 0.1 - 0.9 x10E3/uL   EOS (ABSOLUTE) 0.2 0.0 - 0.4 x10E3/uL   Basophils Absolute 0.0 0.0 - 0.2 x10E3/uL   Immature Granulocytes 0 Not Estab. %   Immature Grans (Abs) 0.0 0.0 - 0.1 x10E3/uL  Comprehensive metabolic panel  Result Value Ref Range   Glucose 97 70 - 99 mg/dL   BUN 16 6 - 24 mg/dL   Creatinine, Ser 0.79 0.57 - 1.00 mg/dL   eGFR 92 >59 mL/min/1.73   BUN/Creatinine Ratio 20 9 - 23   Sodium 144 134 - 144 mmol/L   Potassium 4.8 3.5 - 5.2 mmol/L   Chloride 103 96 - 106 mmol/L   CO2 26 20 - 29 mmol/L   Calcium 9.6 8.7 - 10.2 mg/dL   Total Protein 7.3 6.0 - 8.5 g/dL   Albumin 4.7 3.8 - 4.8 g/dL   Globulin, Total 2.6 1.5 - 4.5 g/dL   Albumin/Globulin Ratio 1.8 1.2 - 2.2   Bilirubin Total 0.3 0.0 - 1.2 mg/dL   Alkaline Phosphatase 142 (H) 44 - 121 IU/L   AST 19 0 - 40 IU/L   ALT 21 0 - 32 IU/L  Lipid panel  Result Value Ref Range   Cholesterol, Total 176 100 - 199 mg/dL   Triglycerides 136 0 - 149 mg/dL   HDL 39 (L) >39 mg/dL   VLDL Cholesterol Cal 24 5 - 40 mg/dL   LDL Chol Calc (NIH) 113 (H) 0 - 99 mg/dL   Chol/HDL Ratio 4.5 (H) 0.0 - 4.4 ratio  TSH  Result Value Ref Range   TSH 2.010 0.450 - 4.500 uIU/mL  Urinalysis, Routine w reflex microscopic  Result Value Ref Range   Specific Gravity, UA 1.017 1.005 - 1.030   pH, UA 5.0 5.0 - 7.5   Color, UA Yellow Yellow   Appearance Ur Clear Clear   Leukocytes,UA Negative Negative   Protein,UA Negative Negative/Trace   Glucose, UA Negative Negative   Ketones, UA Negative Negative   RBC, UA Negative Negative   Bilirubin, UA Negative Negative  Urobilinogen, Ur 0.2 0.2 - 1.0 mg/dL   Nitrite, UA Negative Negative   Microscopic Examination Comment   FSH/LH  Result Value Ref Range   LH 14.9 mIU/mL   FSH 19.4 mIU/mL  Estradiol  Result Value Ref Range    Estradiol 24.3 pg/mL  Estrogens, total  Result Value Ref Range   Estrogen 131 pg/mL  Vitamin D (25 hydroxy)  Result Value Ref Range   Vit D, 25-Hydroxy 44.3 30.0 - 100.0 ng/mL  Cytology - PAP  Result Value Ref Range   Adequacy      Satisfactory for evaluation; transformation zone component PRESENT.   Diagnosis      - Negative for intraepithelial lesion or malignancy (NILM)      Assessment & Plan:   Problem List Items Addressed This Visit       Cardiovascular and Mediastinum   Hypertension - Primary    Chronic.  Controlled.  Continue with current medication regimen on Lisinopril 30m and HCTZ 213mdaily.  Patient does have a cough but is unsure if it is related to the medication or a recent illness.  Will change medication to ARB if cough persists. Return to clinic in 3 months for reevaluation.  Call sooner if concerns arise.        Other Visit Diagnoses     Elevated fasting glucose       Labs ordered today. Will make recommendations based on lab results.   Relevant Orders   HgB A1c   Left foot pain       Xray ordered. Will make recommendations based on lab results. Will refer to Ortho if necessary for foot pain.   Relevant Orders   DG Foot Complete Left        Follow up plan: Return in about 3 months (around 07/06/2021) for HTN, HLD, DM2 FU.

## 2021-04-07 ENCOUNTER — Ambulatory Visit: Payer: BC Managed Care – PPO | Admitting: Nurse Practitioner

## 2021-04-07 ENCOUNTER — Ambulatory Visit
Admission: RE | Admit: 2021-04-07 | Discharge: 2021-04-07 | Disposition: A | Discharge status: Home / Self Care | Payer: BC Managed Care – PPO | Source: Ambulatory Visit | Attending: Nurse Practitioner | Admitting: Nurse Practitioner

## 2021-04-07 ENCOUNTER — Other Ambulatory Visit: Payer: Self-pay

## 2021-04-07 ENCOUNTER — Ambulatory Visit
Admission: RE | Admit: 2021-04-07 | Discharge: 2021-04-07 | Disposition: A | Discharge status: Home / Self Care | Payer: BC Managed Care – PPO | Attending: Nurse Practitioner | Admitting: Nurse Practitioner

## 2021-04-07 ENCOUNTER — Encounter: Payer: Self-pay | Admitting: Nurse Practitioner

## 2021-04-07 VITALS — BP 103/68 | HR 70 | Temp 98.3°F | Wt 221.6 lb

## 2021-04-07 DIAGNOSIS — M79672 Pain in left foot: Secondary | ICD-10-CM

## 2021-04-07 DIAGNOSIS — I10 Essential (primary) hypertension: Secondary | ICD-10-CM

## 2021-04-07 DIAGNOSIS — R7301 Impaired fasting glucose: Secondary | ICD-10-CM | POA: Diagnosis not present

## 2021-04-07 NOTE — Assessment & Plan Note (Signed)
Chronic.  Controlled.  Continue with current medication regimen on Lisinopril 20mg  and HCTZ 25mg  daily.  Patient does have a cough but is unsure if it is related to the medication or a recent illness.  Will change medication to ARB if cough persists. Return to clinic in 3 months for reevaluation.  Call sooner if concerns arise.

## 2021-04-08 ENCOUNTER — Encounter: Payer: Self-pay | Admitting: Nurse Practitioner

## 2021-04-08 LAB — HEMOGLOBIN A1C
Est. average glucose Bld gHb Est-mCnc: 128 mg/dL
Hgb A1c MFr Bld: 6.1 % — ABNORMAL HIGH (ref 4.8–5.6)

## 2021-04-08 NOTE — Progress Notes (Signed)
HI Kari Brandt.  It was good to see you yesterday.  Your A1c shows that you are prediabetic.  No medications at this time. I recommend decreasing your carbohydrate intake.  Follow up as discussed.  We will continue to monitor this in the future.

## 2021-04-08 NOTE — Addendum Note (Signed)
Addended by: [name] on: 04/08/2021 10:16 AM   Modules accepted: Orders

## 2021-04-08 NOTE — Progress Notes (Signed)
Please let patient know that there is a small bone spur on her foot.  No evidence of fracture.  I will place a referral for her to see Orthopedics.

## 2021-04-09 ENCOUNTER — Ambulatory Visit: Payer: BC Managed Care – PPO | Admitting: Podiatry

## 2021-04-16 ENCOUNTER — Other Ambulatory Visit: Payer: Self-pay

## 2021-04-16 ENCOUNTER — Ambulatory Visit: Payer: BC Managed Care – PPO | Admitting: Podiatry

## 2021-04-16 ENCOUNTER — Encounter: Payer: Self-pay | Admitting: Podiatry

## 2021-04-16 ENCOUNTER — Ambulatory Visit: Payer: BC Managed Care – PPO

## 2021-04-16 DIAGNOSIS — M778 Other enthesopathies, not elsewhere classified: Secondary | ICD-10-CM

## 2021-04-16 DIAGNOSIS — M722 Plantar fascial fibromatosis: Secondary | ICD-10-CM | POA: Diagnosis not present

## 2021-04-16 MED ORDER — MELOXICAM 15 MG PO TABS: 15.0000 mg | ORAL_TABLET | Freq: Every day | ORAL | 3 refills | Status: DC

## 2021-04-16 MED ORDER — METHYLPREDNISOLONE 4 MG PO TBPK: ORAL_TABLET | ORAL | 0 refills | Status: DC

## 2021-04-16 MED ORDER — TRIAMCINOLONE ACETONIDE 40 MG/ML IJ SUSP
20.0000 mg | Freq: Once | INTRAMUSCULAR | Status: AC
  Administered 2021-04-16: 11:00:00 20 mg

## 2021-04-16 NOTE — Patient Instructions (Signed)

## 2021-04-16 NOTE — Progress Notes (Signed)
Subjective:  Patient ID: Kari Brandt, female    DOB: 12/08/1970,  MRN: [id number] HPI Chief Complaint  Patient presents with   Foot Pain    Plantar heel left - aching x few months, AM pain, sharp sensations throughout the day now, no treatment, PCP evaluated - xrayed, referred here   New Patient (Initial Visit)    51 y.o. female presents with the above complaint.   ROS: Denies fever chills nausea vomiting muscle aches pains calf pain back pain chest pain shortness of breath.  Past Medical History:  Diagnosis Date   Anxiety    GERD (gastroesophageal reflux disease)    Hypertension    Past Surgical History:  Procedure Laterality Date   ENDOMETRIAL ABLATION  2007   her option   TUBAL LIGATION     WRIST SURGERY      Current Outpatient Medications:    meloxicam (MOBIC) 15 MG tablet, Take 1 tablet (15 mg total) by mouth daily., Disp: 30 tablet, Rfl: 3   methylPREDNISolone (MEDROL DOSEPAK) 4 MG TBPK tablet, 6 day dose pack - take as directed, Disp: 21 tablet, Rfl: 0   Ascorbic Acid (VITAMIN C) 1000 MG tablet, Take 1,000 mg by mouth daily., Disp: , Rfl:    ASPIRIN 81 PO, Take 1 tablet by mouth daily., Disp: , Rfl:    busPIRone (BUSPAR) 5 MG tablet, Take 1 tablet (5 mg total) by mouth 2 (two) times daily as needed (anxiety, panic)., Disp: 30 tablet, Rfl: 1   hydrochlorothiazide (HYDRODIURIL) 25 MG tablet, Take 1 tablet (25 mg total) by mouth daily. for high blood pressure, Disp: 90 tablet, Rfl: 1   lisinopril (ZESTRIL) 20 MG tablet, Take 1 tablet (20 mg total) by mouth daily., Disp: 90 tablet, Rfl: 0   pantoprazole (PROTONIX) 40 MG tablet, Take 1 tablet (40 mg total) by mouth daily as needed., Disp: 90 tablet, Rfl: 1   VITAMIN D PO, Take by mouth., Disp: , Rfl:   No Known Allergies Review of Systems Objective:  There were no vitals filed for this visit.  General: Well developed, nourished, in no acute distress, alert and oriented x3   Dermatological: Skin is warm, dry  and supple bilateral. Nails x 10 are well maintained; remaining integument appears unremarkable at this time. There are no open sores, no preulcerative lesions, no rash or signs of infection present.  Vascular: Dorsalis Pedis artery and Posterior Tibial artery pedal pulses are 2/4 bilateral with immedate capillary fill time. Pedal hair growth present. No varicosities and no lower extremity edema present bilateral.   Neruologic: Grossly intact via light touch bilateral. Vibratory intact via tuning fork bilateral. Protective threshold with Semmes Wienstein monofilament intact to all pedal sites bilateral. Patellar and Achilles deep tendon reflexes 2+ bilateral. No Babinski or clonus noted bilateral.   Musculoskeletal: No gross boney pedal deformities bilateral. No pain, crepitus, or limitation noted with foot and ankle range of motion bilateral. Muscular strength 5/5 in all groups tested bilateral.  Pain to palpation MucoClear tubercle of the left heel.  Gait: Unassisted, Nonantalgic.    Radiographs:  Radiographs reviewed today demonstrate a plantar distally oriented calcaneal heel spur soft tissue increase in density piperocaine insertion site.  Assessment & Plan:   Assessment: Planter fasciitis left.  Plan: Injected the left heel today 20 mg Kenalog 5 mg Marcaine start her on a Medrol Dosepak to be followed by meloxicam.  Placed in a plantar brace and a night splint.  Discussed appropriate shoe gear stretching size ice  therapy sugar modifications.     Kari Brandt T. [place], [place]

## 2021-05-21 ENCOUNTER — Encounter: Payer: Self-pay | Admitting: Podiatry

## 2021-05-21 ENCOUNTER — Other Ambulatory Visit: Payer: Self-pay

## 2021-05-21 ENCOUNTER — Ambulatory Visit: Payer: BC Managed Care – PPO | Admitting: Podiatry

## 2021-05-21 DIAGNOSIS — M722 Plantar fascial fibromatosis: Secondary | ICD-10-CM

## 2021-05-21 MED ORDER — TRIAMCINOLONE ACETONIDE 40 MG/ML IJ SUSP
20.0000 mg | Freq: Once | INTRAMUSCULAR | Status: AC
  Administered 2021-05-21: 20 mg

## 2021-05-21 NOTE — Progress Notes (Signed)
Thank you presents today for follow-up of her left heel she states that is still sore but is some better than it was.  States that it had probably gotten approximately 80% improved and has regressed down to about 40% at this point.  Continues to take her meloxicam daily where her plantar fascial brace and her good shoes most of the time. ? ?Objective: Vital signs are stable she is alert and oriented x3 she still has pain on palpation of the central band of the plantar fascia a little more proximally than last visit.  No pain on medial lateral compression of the calcaneus. ? ?Assessment: Pain in limb secondary to central band Planter fasciitis. ? ?Plan: I injected the central band today from a little more proximal to the plantar aspect of the fascia.  She tolerated this well injected 10 mg Kenalog 5 mg Marcaine.  She will continue with her meloxicam plantar fascia brace and tennis shoes.  Follow-up with her in 1 month if necessary ?

## 2021-06-12 ENCOUNTER — Other Ambulatory Visit: Payer: Self-pay | Admitting: Nurse Practitioner

## 2021-06-12 MED ORDER — LISINOPRIL 20 MG PO TABS: 20.0000 mg | ORAL_TABLET | Freq: Every day | ORAL | 1 refills | Status: DC

## 2021-06-25 ENCOUNTER — Encounter: Payer: Self-pay | Admitting: Podiatry

## 2021-06-25 ENCOUNTER — Ambulatory Visit: Payer: BC Managed Care – PPO | Admitting: Podiatry

## 2021-06-25 DIAGNOSIS — M722 Plantar fascial fibromatosis: Secondary | ICD-10-CM | POA: Diagnosis not present

## 2021-06-25 NOTE — Progress Notes (Signed)
She presents today for follow-up of her Planter fasciitis stating that is feeling much better. ? ?Objective: Vital signs stable alert and oriented x3.  Pulses are palpable.  She has some tenderness on palpation MucoClear tubercle of the left heel. ? ?Assessment: Slowly resolving Planter fasciitis. ? ?Plan: Secondary to all of her walking and activities and the work that she has to do I think that getting her in with [facility] for set of orthotics is going to be most beneficial. ?

## 2021-07-06 NOTE — Progress Notes (Signed)
? ?BP (!) 187/90   Pulse 67   Temp 98.6 ?F (37 ?C) (Oral)   Wt 223 lb 9.6 oz (101.4 kg)   LMP 06/09/2021 (Approximate)   SpO2 97%   BMI 41.97 kg/m?   ? ?Subjective:  ? ? Patient ID: Kari Brandt, female    DOB: April 22, 1970, 51 y.o.   MRN: [id number] ? ?HPI: ?Kari Brandt is a 51 y.o. female ? ?Chief Complaint  ?Patient presents with  ? Hypertension  ?  3 month f/u   ? Hyperlipidemia  ? Diabetes  ? ?HYPERTENSION / HYPERLIPIDEMIA ?Satisfied with current treatment? yes ?Duration of hypertension: years ?BP monitoring frequency: not checking ?BP range:  ?BP medication side effects: no ?Past BP meds: lisinopril-HCTZ ?Duration of hyperlipidemia: years ?Cholesterol medication side effects: no ?Cholesterol supplements: none ?Past cholesterol medications: none ?Medication compliance: excellent compliance ?Aspirin: no ?Recent stressors: no ?Recurrent headaches: no ?Visual changes: no ?Palpitations: no ?Dyspnea: no ?Chest pain: no ?Lower extremity edema: no ?Dizzy/lightheaded: no ? ?DEPRESSION/ANXIETY ?Patient states she has been having more panic attacks.  Patient isn't sure if the buspar is helping.  She is only taking it as needed. Denies SI. ? ?[facility] [facility] Visit from 07/07/2021 in [place]  ?PHQ-9 Total Score 3  ? ?  ? ? ?  07/07/2021  ? 11:01 AM 04/07/2021  ?  9:15 AM 03/05/2021  ?  8:16 AM 12/04/2020  ?  8:45 AM  ?GAD 7 : Generalized Anxiety Score  ?Nervous, Anxious, on Edge 0 1 0 1  ?Control/stop worrying 0 0 1 0  ?Worry too much - different things 0 1 1 0  ?Trouble relaxing 0 0 0 1  ?Restless 0 '1 1 1  ' ?Easily annoyed or irritable 0 1 1 0  ?Afraid - awful might happen 0 0 0 0  ?Total GAD 7 Score 0 '4 4 3  ' ?Anxiety Difficulty Not difficult at all Not difficult at all Somewhat difficult Not difficult at all  ? ? ? ? ?Relevant past medical, surgical, family and social history reviewed and updated as indicated. Interim medical history since our last visit reviewed. ?Allergies and  medications reviewed and updated. ? ?Review of Systems  ?Eyes:  Negative for visual disturbance.  ?Respiratory:  Negative for cough, chest tightness and shortness of breath.   ?Cardiovascular:  Negative for chest pain, palpitations and leg swelling.  ?Musculoskeletal:   ?     Left foot pain  ?Neurological:  Negative for dizziness and headaches.  ?Psychiatric/Behavioral:  Negative for dysphoric mood. The patient is nervous/anxious.   ? ?Per HPI unless specifically indicated above ? ?   ?Objective:  ?  ?BP (!) 187/90   Pulse 67   Temp 98.6 ?F (37 ?C) (Oral)   Wt 223 lb 9.6 oz (101.4 kg)   LMP 06/09/2021 (Approximate)   SpO2 97%   BMI 41.97 kg/m?   ?Wt Readings from Last 3 Encounters:  ?07/07/21 223 lb 9.6 oz (101.4 kg)  ?04/07/21 221 lb 9.6 oz (100.5 kg)  ?03/05/21 223 lb 12.8 oz (101.5 kg)  ?  ?Physical Exam ?Vitals and nursing note reviewed.  ?Constitutional:   ?   General: She is not in acute distress. ?   Appearance: Normal appearance. She is obese. She is not ill-appearing, toxic-appearing or diaphoretic.  ?HENT:  ?   Head: Normocephalic.  ?   Right Ear: External ear normal.  ?   Left Ear: External ear normal.  ?   Nose: Nose normal.  ?  Mouth/Throat:  ?   Mouth: Mucous membranes are moist.  ?   Pharynx: Oropharynx is clear.  ?Eyes:  ?   General:     ?   Right eye: No discharge.     ?   Left eye: No discharge.  ?   Extraocular Movements: Extraocular movements intact.  ?   Conjunctiva/sclera: Conjunctivae normal.  ?   Pupils: Pupils are equal, round, and reactive to light.  ?Cardiovascular:  ?   Rate and Rhythm: Normal rate and regular rhythm.  ?   Heart sounds: No murmur heard. ?Pulmonary:  ?   Effort: Pulmonary effort is normal. No respiratory distress.  ?   Breath sounds: Normal breath sounds. No wheezing or rales.  ?Musculoskeletal:  ?   Cervical back: Normal range of motion and neck supple.  ?Skin: ?   General: Skin is warm and dry.  ?   Capillary Refill: Capillary refill takes less than 2 seconds.   ?Neurological:  ?   General: No focal deficit present.  ?   Mental Status: She is alert and oriented to person, place, and time. Mental status is at baseline.  ?Psychiatric:     ?   Mood and Affect: Mood normal.     ?   Behavior: Behavior normal.     ?   Thought Content: Thought content normal.     ?   Judgment: Judgment normal.  ? ? ?Results for orders placed or performed in visit on 04/07/21  ?HgB A1c  ?Result Value Ref Range  ? Hgb A1c MFr Bld 6.1 (H) 4.8 - 5.6 %  ? Est. average glucose Bld gHb Est-mCnc 128 mg/dL  ? ?   ?Assessment & Plan:  ? ?Problem List Items Addressed This Visit   ? ?  ? Cardiovascular and Mediastinum  ? Hypertension - Primary  ?  Chronic. Elevated at visit today. Patient has been taking Mobic daily.  Educated patient on not taking Mobic daily due to it elevating blood pressure.  Recommend stopping the medication.  Follow up in 1 month for reevaluation.  If still elevated, will increase Lisinopril to 18m.  ? ?  ?  ? Relevant Orders  ? Comp Met (CMET)  ?  ? Other  ? Anxiety  ?  Chronic. Not well controlled.  Recommend changing dose of Buspar from 51mBID PRN to Buspar 74m83mID scheduled.  Patient understands and agrees with the plan of care.   ? ?  ?  ? BMI 40.0-44.9, adult (HCCBrainards?  Recommend a healthy lifestyle through diet and exercise.  Recommend smaller meals that prioritize protein. ? ?  ?  ? Vitamin D deficiency  ?  Labs ordered today. Will make recommendations based on lab results. ? ?  ?  ? Relevant Orders  ? Vitamin D (25 hydroxy)  ? Mixed hyperlipidemia  ?  Chronic.  Controlled.  Continue with current medication regimen.  Labs ordered today.  Return to clinic in 6 months for reevaluation.  Call sooner if concerns arise.  ? ? ?  ?  ? Relevant Orders  ? Lipid Profile  ?  ? ?Follow up plan: ?Return in about 1 month (around 08/06/2021) for BP Check, Depression/Anxiety FU. ? ? ? ? ? ?

## 2021-07-07 ENCOUNTER — Encounter: Payer: Self-pay | Admitting: Nurse Practitioner

## 2021-07-07 ENCOUNTER — Ambulatory Visit (INDEPENDENT_AMBULATORY_CARE_PROVIDER_SITE_OTHER): Payer: BC Managed Care – PPO | Admitting: Nurse Practitioner

## 2021-07-07 VITALS — BP 187/90 | HR 67 | Temp 98.6°F | Wt 223.6 lb

## 2021-07-07 DIAGNOSIS — Z6841 Body Mass Index (BMI) 40.0 and over, adult: Secondary | ICD-10-CM

## 2021-07-07 DIAGNOSIS — I10 Essential (primary) hypertension: Secondary | ICD-10-CM | POA: Diagnosis not present

## 2021-07-07 DIAGNOSIS — F419 Anxiety disorder, unspecified: Secondary | ICD-10-CM

## 2021-07-07 DIAGNOSIS — E782 Mixed hyperlipidemia: Secondary | ICD-10-CM

## 2021-07-07 DIAGNOSIS — E559 Vitamin D deficiency, unspecified: Secondary | ICD-10-CM

## 2021-07-07 NOTE — Assessment & Plan Note (Signed)
Chronic. Elevated at visit today. Patient has been taking Mobic daily.  Educated patient on not taking Mobic daily due to it elevating blood pressure.  Recommend stopping the medication.  Follow up in 1 month for reevaluation.  If still elevated, will increase Lisinopril to 40mg .  ?

## 2021-07-07 NOTE — Assessment & Plan Note (Signed)
Chronic.  Controlled.  Continue with current medication regimen.  Labs ordered today.  Return to clinic in 6 months for reevaluation.  Call sooner if concerns arise.  ? ?

## 2021-07-07 NOTE — Assessment & Plan Note (Signed)
Labs ordered today.  Will make recommendations based on lab results. ?

## 2021-07-07 NOTE — Assessment & Plan Note (Signed)
Recommend a healthy lifestyle through diet and exercise.  Recommend smaller meals that prioritize protein.  °

## 2021-07-07 NOTE — Assessment & Plan Note (Signed)
Chronic. Not well controlled.  Recommend changing dose of Buspar from 5mg  BID PRN to Buspar 5mg  BID scheduled.  Patient understands and agrees with the plan of care.   ?

## 2021-07-08 LAB — COMPREHENSIVE METABOLIC PANEL
ALT: 22 IU/L (ref 0–32)
AST: 19 IU/L (ref 0–40)
Albumin/Globulin Ratio: 2.1 (ref 1.2–2.2)
Albumin: 4.4 g/dL (ref 3.8–4.8)
Alkaline Phosphatase: 130 IU/L — ABNORMAL HIGH (ref 44–121)
BUN/Creatinine Ratio: 19 (ref 9–23)
BUN: 13 mg/dL (ref 6–24)
Bilirubin Total: <0.2 mg/dL (ref 0.0–1.2)
CO2: 26 mmol/L (ref 20–29)
Calcium: 9.4 mg/dL (ref 8.7–10.2)
Chloride: 104 mmol/L (ref 96–106)
Creatinine, Ser: 0.7 mg/dL (ref 0.57–1.00)
Globulin, Total: 2.1 g/dL (ref 1.5–4.5)
Glucose: 100 mg/dL — ABNORMAL HIGH (ref 70–99)
Potassium: 4.3 mmol/L (ref 3.5–5.2)
Sodium: 143 mmol/L (ref 134–144)
Total Protein: 6.5 g/dL (ref 6.0–8.5)
eGFR: 107 mL/min/{1.73_m2} (ref >59)

## 2021-07-08 LAB — LIPID PANEL
Chol/HDL Ratio: 3.5 ratio (ref 0.0–4.4)
Cholesterol, Total: 177 mg/dL (ref 100–199)
HDL: 50 mg/dL (ref >39)
LDL Chol Calc (NIH): 108 mg/dL — ABNORMAL HIGH (ref 0–99)
Triglycerides: 103 mg/dL (ref 0–149)
VLDL Cholesterol Cal: 19 mg/dL (ref 5–40)

## 2021-07-08 LAB — VITAMIN D 25 HYDROXY (VIT D DEFICIENCY, FRACTURES): Vit D, 25-Hydroxy: 44 ng/mL (ref 30.0–100.0)

## 2021-07-08 NOTE — Progress Notes (Signed)
Please let patient know that her lab work shows that her liver enzymes improved some.  Her cholesterol is just above normal.  I recommend a low fat diet and exercise.  Her vitamin D is within normal range.  Please let me know if she has any questions.  I will see her at our next visit.

## 2021-07-11 ENCOUNTER — Ambulatory Visit (INDEPENDENT_AMBULATORY_CARE_PROVIDER_SITE_OTHER): Payer: BC Managed Care – PPO

## 2021-07-11 DIAGNOSIS — M722 Plantar fascial fibromatosis: Secondary | ICD-10-CM | POA: Diagnosis not present

## 2021-07-11 NOTE — Progress Notes (Signed)
SITUATION ?Reason for Consult: Evaluation for Bilateral Custom Foot Orthoses ?Patient / Caregiver Report: Patient is ready for foot orthotics ? ?OBJECTIVE DATA: ?Patient History / Diagnosis:  ?  ICD-10-CM   ?1. Plantar fasciitis of left foot  M72.2   ?  ? ? ?Current or Previous Devices:   None and no history ? ?Foot Examination: ?Skin presentation:   Intact ?Ulcers & Callousing:   None ?Toe / Foot Deformities:  None ?Weight Bearing Presentation:  Rectus ?Sensation:    Intact ? ?Shoe Size:    108M ? ?ORTHOTIC RECOMMENDATION ?Recommended Device: 1x pair of custom functional foot orthotics ? ?GOALS OF ORTHOSES ?- Reduce Pain ?- Prevent Foot Deformity ?- Prevent Progression of Further Foot Deformity ?- Relieve Pressure ?- Improve the Overall Biomechanical Function of the Foot and Lower Extremity. ? ?ACTIONS PERFORMED ?Potential out of pocket cost was communicated to patient. Patient understood and consent to casting. Patient was casted for Foot Orthoses via crush box. Procedure was explained and patient tolerated procedure well. Casts were shipped to central fabrication. All questions were answered and concerns addressed. ? ?PLAN ?Patient is to be called for fitting when devices are ready.  ? ? ?

## 2021-08-03 ENCOUNTER — Other Ambulatory Visit: Payer: Self-pay | Admitting: Nurse Practitioner

## 2021-08-05 NOTE — Telephone Encounter (Signed)
Rx: HCTZ 03/05/21 #90 1RF-too soon ?Requested Prescriptions  ?Pending Prescriptions Disp Refills  ?? pantoprazole (PROTONIX) 40 MG tablet [Pharmacy Med Name: Pantoprazole Sodium 40 MG Oral Tablet Delayed Release] 90 tablet 0  ?  Sig: TAKE 1 TABLET BY MOUTH ONCE DAILY AS NEEDED  ?  ? Gastroenterology: Proton Pump Inhibitors Passed - 08/03/2021  3:06 PM  ?  ?  Passed - Valid encounter within last 12 months  ?  Recent Outpatient Visits   ?      ? 4 weeks ago Primary hypertension  ? [facility] [name], NP  ? 4 months ago Primary hypertension  ? [facility] [name], NP  ? 5 months ago Annual physical exam  ? [facility] [name], NP  ? 8 months ago BMI 40.0-44.9, adult [facility])  ? [facility] [name], NP  ? 11 months ago Anxiety  ? [facility] [name], NP  ?  ?  ?Future Appointments   ?        ? In 2 days [name], NP [facility]  ?  ? ?  ?  ?  ?? hydrochlorothiazide (HYDRODIURIL) 25 MG tablet [Pharmacy Med Name: hydroCHLOROthiazide 25 MG Oral Tablet] 90 tablet 0  ?  Sig: TAKE 1 TABLET BY MOUTH ONCE DAILY FOR HIGH BLOOD PRESSURE  ?  ? Cardiovascular: Diuretics - Thiazide Failed - 08/03/2021  3:06 PM  ?  ?  Failed - Last BP in normal range  ?  BP Readings from Last 1 Encounters:  ?07/07/21 (!) 187/90  ?   ?  ?  Passed - Cr in normal range and within 180 days  ?  Creatinine, Ser  ?Date Value Ref Range Status  ?07/07/2021 0.70 0.57 - 1.00 mg/dL Final  ?   ?  ?  Passed - K in normal range and within 180 days  ?  Potassium  ?Date Value Ref Range Status  ?07/07/2021 4.3 3.5 - 5.2 mmol/L Final  ?   ?  ?  Passed - Na in normal range and within 180 days  ?  Sodium  ?Date Value Ref Range Status  ?07/07/2021 143 134 - 144 mmol/L Final  ?   ?  ?  Passed - Valid encounter within last 6 months  ?  Recent Outpatient Visits   ?      ? 4 weeks ago Primary hypertension  ? [facility]  [name], NP  ? 4 months ago Primary hypertension  ? [facility] [name], NP  ? 5 months ago Annual physical exam  ? [facility] [name], NP  ? 8 months ago BMI 40.0-44.9, adult [facility])  ? [facility] [name], NP  ? 11 months ago Anxiety  ? [facility] [name], NP  ?  ?  ?Future Appointments   ?        ? In 2 days [name], NP [facility]  ?  ? ?  ?  ?  ? ?

## 2021-08-06 NOTE — Progress Notes (Deleted)
There were no vitals taken for this visit.   Subjective:    Patient ID: Kari Brandt, female    DOB: [birth year], 51 y.o.   MRN: [id number]  HPI: Kari Brandt is a 51 y.o. female  No chief complaint on file.  HYPERTENSION / HYPERLIPIDEMIA Satisfied with current treatment? yes Duration of hypertension: years BP monitoring frequency: not checking BP range:  BP medication side effects: no Past BP meds: lisinopril-HCTZ Duration of hyperlipidemia: years Cholesterol medication side effects: no Cholesterol supplements: none Past cholesterol medications: none Medication compliance: excellent compliance Aspirin: no Recent stressors: no Recurrent headaches: no Visual changes: no Palpitations: no Dyspnea: no Chest pain: no Lower extremity edema: no Dizzy/lightheaded: no  DEPRESSION/ANXIETY Patient states she has been having more panic attacks.  Patient isn't sure if the buspar is helping.  She is only taking it as needed. Denies SI.  [place] Visit from 07/07/2021 in [place]  PHQ-9 Total Score 3        07/07/2021   11:01 AM 04/07/2021    9:15 AM 03/05/2021    8:16 AM 12/04/2020    8:45 AM  GAD 7 : Generalized Anxiety Score  Nervous, Anxious, on Edge 0 1 0 1  Control/stop worrying 0 0 1 0  Worry too much - different things 0 1 1 0  Trouble relaxing 0 0 0 1  Restless 0 '1 1 1  ' Easily annoyed or irritable 0 1 1 0  Afraid - awful might happen 0 0 0 0  Total GAD 7 Score 0 '4 4 3  ' Anxiety Difficulty Not difficult at all Not difficult at all Somewhat difficult Not difficult at all      Relevant past medical, surgical, family and social history reviewed and updated as indicated. Interim medical history since our last visit reviewed. Allergies and medications reviewed and updated.  Review of Systems  Eyes:  Negative for visual disturbance.  Respiratory:  Negative for cough, chest tightness and shortness of breath.   Cardiovascular:   Negative for chest pain, palpitations and leg swelling.  Musculoskeletal:        Left foot pain  Neurological:  Negative for dizziness and headaches.  Psychiatric/Behavioral:  Negative for dysphoric mood. The patient is nervous/anxious.    Per HPI unless specifically indicated above     Objective:    There were no vitals taken for this visit.  Wt Readings from Last 3 Encounters:  07/07/21 223 lb 9.6 oz (101.4 kg)  04/07/21 221 lb 9.6 oz (100.5 kg)  03/05/21 223 lb 12.8 oz (101.5 kg)    Physical Exam Vitals and nursing note reviewed.  Constitutional:      General: She is not in acute distress.    Appearance: Normal appearance. She is obese. She is not ill-appearing, toxic-appearing or diaphoretic.  HENT:     Head: Normocephalic.     Right Ear: External ear normal.     Left Ear: External ear normal.     Nose: Nose normal.     Mouth/Throat:     Mouth: Mucous membranes are moist.     Pharynx: Oropharynx is clear.  Eyes:     General:        Right eye: No discharge.        Left eye: No discharge.     Extraocular Movements: Extraocular movements intact.     Conjunctiva/sclera: Conjunctivae normal.     Pupils: Pupils are equal, round, and reactive to light.  Cardiovascular:  Rate and Rhythm: Normal rate and regular rhythm.     Heart sounds: No murmur heard. Pulmonary:     Effort: Pulmonary effort is normal. No respiratory distress.     Breath sounds: Normal breath sounds. No wheezing or rales.  Musculoskeletal:     Cervical back: Normal range of motion and neck supple.  Skin:    General: Skin is warm and dry.     Capillary Refill: Capillary refill takes less than 2 seconds.  Neurological:     General: No focal deficit present.     Mental Status: She is alert and oriented to person, place, and time. Mental status is at baseline.  Psychiatric:        Mood and Affect: Mood normal.        Behavior: Behavior normal.        Thought Content: Thought content normal.         Judgment: Judgment normal.    Results for orders placed or performed in visit on 07/07/21  Comp Met (CMET)  Result Value Ref Range   Glucose 100 (H) 70 - 99 mg/dL   BUN 13 6 - 24 mg/dL   Creatinine, Ser 0.70 0.57 - 1.00 mg/dL   eGFR 107 >59 mL/min/1.73   BUN/Creatinine Ratio 19 9 - 23   Sodium 143 134 - 144 mmol/L   Potassium 4.3 3.5 - 5.2 mmol/L   Chloride 104 96 - 106 mmol/L   CO2 26 20 - 29 mmol/L   Calcium 9.4 8.7 - 10.2 mg/dL   Total Protein 6.5 6.0 - 8.5 g/dL   Albumin 4.4 3.8 - 4.8 g/dL   Globulin, Total 2.1 1.5 - 4.5 g/dL   Albumin/Globulin Ratio 2.1 1.2 - 2.2   Bilirubin Total <0.2 0.0 - 1.2 mg/dL   Alkaline Phosphatase 130 (H) 44 - 121 IU/L   AST 19 0 - 40 IU/L   ALT 22 0 - 32 IU/L  Lipid Profile  Result Value Ref Range   Cholesterol, Total 177 100 - 199 mg/dL   Triglycerides 103 0 - 149 mg/dL   HDL 50 >39 mg/dL   VLDL Cholesterol Cal 19 5 - 40 mg/dL   LDL Chol Calc (NIH) 108 (H) 0 - 99 mg/dL   Chol/HDL Ratio 3.5 0.0 - 4.4 ratio  Vitamin D (25 hydroxy)  Result Value Ref Range   Vit D, 25-Hydroxy 44.0 30.0 - 100.0 ng/mL      Assessment & Plan:   Problem List Items Addressed This Visit      Cardiovascular and Mediastinum   Hypertension - Primary     Other   Anxiety     Follow up plan: No follow-ups on file.

## 2021-08-07 ENCOUNTER — Ambulatory Visit: Payer: BC Managed Care – PPO | Admitting: Nurse Practitioner

## 2021-08-19 ENCOUNTER — Telehealth: Payer: Self-pay

## 2021-08-19 NOTE — Telephone Encounter (Signed)
Foot orthotics ready - lvm for patient to call and schedule pickup

## 2021-08-27 NOTE — Progress Notes (Deleted)
There were no vitals taken for this visit.   Subjective:    Patient ID: Kari Brandt, female    DOB: 04-Feb-1971, 51 y.o.   MRN: [id number]  HPI: Kari Brandt is a 51 y.o. female  No chief complaint on file.  HYPERTENSION / HYPERLIPIDEMIA Satisfied with current treatment? yes Duration of hypertension: years BP monitoring frequency: not checking BP range:  BP medication side effects: no Past BP meds: lisinopril-HCTZ Duration of hyperlipidemia: years Cholesterol medication side effects: no Cholesterol supplements: none Past cholesterol medications: none Medication compliance: excellent compliance Aspirin: no Recent stressors: no Recurrent headaches: no Visual changes: no Palpitations: no Dyspnea: no Chest pain: no Lower extremity edema: no Dizzy/lightheaded: no  DEPRESSION/ANXIETY Patient states she has been having more panic attacks.  Patient isn't sure if the buspar is helping.  She is only taking it as needed. Denies SI.  [place] Visit from 07/07/2021 in [facility]  PHQ-9 Total Score 3        07/07/2021   11:01 AM 04/07/2021    9:15 AM 03/05/2021    8:16 AM 12/04/2020    8:45 AM  GAD 7 : Generalized Anxiety Score  Nervous, Anxious, on Edge 0 1 0 1  Control/stop worrying 0 0 1 0  Worry too much - different things 0 1 1 0  Trouble relaxing 0 0 0 1  Restless 0 '1 1 1  ' Easily annoyed or irritable 0 1 1 0  Afraid - awful might happen 0 0 0 0  Total GAD 7 Score 0 '4 4 3  ' Anxiety Difficulty Not difficult at all Not difficult at all Somewhat difficult Not difficult at all      Relevant past medical, surgical, family and social history reviewed and updated as indicated. Interim medical history since our last visit reviewed. Allergies and medications reviewed and updated.  Review of Systems  Eyes:  Negative for visual disturbance.  Respiratory:  Negative for cough, chest tightness and shortness of breath.   Cardiovascular:   Negative for chest pain, palpitations and leg swelling.  Musculoskeletal:        Left foot pain  Neurological:  Negative for dizziness and headaches.  Psychiatric/Behavioral:  Negative for dysphoric mood. The patient is nervous/anxious.    Per HPI unless specifically indicated above     Objective:    There were no vitals taken for this visit.  Wt Readings from Last 3 Encounters:  07/07/21 223 lb 9.6 oz (101.4 kg)  04/07/21 221 lb 9.6 oz (100.5 kg)  03/05/21 223 lb 12.8 oz (101.5 kg)    Physical Exam Vitals and nursing note reviewed.  Constitutional:      General: She is not in acute distress.    Appearance: Normal appearance. She is obese. She is not ill-appearing, toxic-appearing or diaphoretic.  HENT:     Head: Normocephalic.     Right Ear: External ear normal.     Left Ear: External ear normal.     Nose: Nose normal.     Mouth/Throat:     Mouth: Mucous membranes are moist.     Pharynx: Oropharynx is clear.  Eyes:     General:        Right eye: No discharge.        Left eye: No discharge.     Extraocular Movements: Extraocular movements intact.     Conjunctiva/sclera: Conjunctivae normal.     Pupils: Pupils are equal, round, and reactive to light.  Cardiovascular:  Rate and Rhythm: Normal rate and regular rhythm.     Heart sounds: No murmur heard. Pulmonary:     Effort: Pulmonary effort is normal. No respiratory distress.     Breath sounds: Normal breath sounds. No wheezing or rales.  Musculoskeletal:     Cervical back: Normal range of motion and neck supple.  Skin:    General: Skin is warm and dry.     Capillary Refill: Capillary refill takes less than 2 seconds.  Neurological:     General: No focal deficit present.     Mental Status: She is alert and oriented to person, place, and time. Mental status is at baseline.  Psychiatric:        Mood and Affect: Mood normal.        Behavior: Behavior normal.        Thought Content: Thought content normal.         Judgment: Judgment normal.    Results for orders placed or performed in visit on 07/07/21  Comp Met (CMET)  Result Value Ref Range   Glucose 100 (H) 70 - 99 mg/dL   BUN 13 6 - 24 mg/dL   Creatinine, Ser 0.70 0.57 - 1.00 mg/dL   eGFR 107 >59 mL/min/1.73   BUN/Creatinine Ratio 19 9 - 23   Sodium 143 134 - 144 mmol/L   Potassium 4.3 3.5 - 5.2 mmol/L   Chloride 104 96 - 106 mmol/L   CO2 26 20 - 29 mmol/L   Calcium 9.4 8.7 - 10.2 mg/dL   Total Protein 6.5 6.0 - 8.5 g/dL   Albumin 4.4 3.8 - 4.8 g/dL   Globulin, Total 2.1 1.5 - 4.5 g/dL   Albumin/Globulin Ratio 2.1 1.2 - 2.2   Bilirubin Total <0.2 0.0 - 1.2 mg/dL   Alkaline Phosphatase 130 (H) 44 - 121 IU/L   AST 19 0 - 40 IU/L   ALT 22 0 - 32 IU/L  Lipid Profile  Result Value Ref Range   Cholesterol, Total 177 100 - 199 mg/dL   Triglycerides 103 0 - 149 mg/dL   HDL 50 >39 mg/dL   VLDL Cholesterol Cal 19 5 - 40 mg/dL   LDL Chol Calc (NIH) 108 (H) 0 - 99 mg/dL   Chol/HDL Ratio 3.5 0.0 - 4.4 ratio  Vitamin D (25 hydroxy)  Result Value Ref Range   Vit D, 25-Hydroxy 44.0 30.0 - 100.0 ng/mL      Assessment & Plan:   Problem List Items Addressed This Visit      Cardiovascular and Mediastinum   Hypertension - Primary     Other   Anxiety     Follow up plan: No follow-ups on file.

## 2021-08-28 ENCOUNTER — Ambulatory Visit: Payer: BC Managed Care – PPO | Admitting: Nurse Practitioner

## 2021-09-03 NOTE — Progress Notes (Signed)
 BP 121/80   Pulse 62   Temp 98.3 F (36.8 C) (Oral)   Wt 224 lb 12.8 oz (102 kg)   LMP 09/01/2021 (Exact Date)   SpO2 98%   BMI 42.20 kg/m    Subjective:    Patient ID: Kari Brandt, female    DOB: 05/17/1970, 50 y.o.   MRN: [id number]  HPI: Kari Brandt is a 50 y.o. female  Chief Complaint  Patient presents with   Hypertension   Depression    1 month follow up pt states her anxiety has been worse, she feels as if maybe it cou.d be bc of the hot weather.    Anxiety   HYPERTENSION / HYPERLIPIDEMIA Satisfied with current treatment? yes Duration of hypertension: years BP monitoring frequency: rarely BP range: 120/80 BP medication side effects: no Past BP meds: lisinopril-HCTZ Duration of hyperlipidemia: years Cholesterol medication side effects: no Cholesterol supplements: none Past cholesterol medications: none Medication compliance: excellent compliance Aspirin: no Recent stressors: no Recurrent headaches: no Visual changes: no Palpitations: no Dyspnea: no Chest pain: no Lower extremity edema: no Dizzy/lightheaded: no  DEPRESSION/ANXIETY Patient states her depression has been worse lately.  States a few times at work she had a panic attack.  She is using the Buspar but not everyday.  Denies SI.  Flowsheet Row Office Visit from 09/04/2021 in [facility]  PHQ-9 Total Score 1         09/04/2021   10:30 AM 07/07/2021   11:01 AM 04/07/2021    9:15 AM 03/05/2021    8:16 AM  GAD 7 : Generalized Anxiety Score  Nervous, Anxious, on Edge 0 0 1 0  Control/stop worrying 0 0 0 1  Worry too much - different things 0 0 1 1  Trouble relaxing 0 0 0 0  Restless 2 0 1 1  Easily annoyed or irritable 1 0 1 1  Afraid - awful might happen 0 0 0 0  Total GAD 7 Score 3 0 4 4  Anxiety Difficulty Not difficult at all Not difficult at all Not difficult at all Somewhat difficult      Relevant past medical, surgical, family and social history  reviewed and updated as indicated. Interim medical history since our last visit reviewed. Allergies and medications reviewed and updated.  Review of Systems  Eyes:  Negative for visual disturbance.  Respiratory:  Negative for cough, chest tightness and shortness of breath.   Cardiovascular:  Negative for chest pain, palpitations and leg swelling.  Musculoskeletal:        Left foot pain  Neurological:  Negative for dizziness and headaches.  Psychiatric/Behavioral:  Positive for dysphoric mood. Negative for suicidal ideas. The patient is nervous/anxious.     Per HPI unless specifically indicated above     Objective:    BP 121/80   Pulse 62   Temp 98.3 F (36.8 C) (Oral)   Wt 224 lb 12.8 oz (102 kg)   LMP 09/01/2021 (Exact Date)   SpO2 98%   BMI 42.20 kg/m   Wt Readings from Last 3 Encounters:  09/04/21 224 lb 12.8 oz (102 kg)  07/07/21 223 lb 9.6 oz (101.4 kg)  04/07/21 221 lb 9.6 oz (100.5 kg)    Physical Exam Vitals and nursing note reviewed.  Constitutional:      General: She is not in acute distress.    Appearance: Normal appearance. She is obese. She is not ill-appearing, toxic-appearing or diaphoretic.  HENT:       Head: Normocephalic.     Right Ear: External ear normal.     Left Ear: External ear normal.     Nose: Nose normal.     Mouth/Throat:     Mouth: Mucous membranes are moist.     Pharynx: Oropharynx is clear.  Eyes:     General:        Right eye: No discharge.        Left eye: No discharge.     Extraocular Movements: Extraocular movements intact.     Conjunctiva/sclera: Conjunctivae normal.     Pupils: Pupils are equal, round, and reactive to light.  Cardiovascular:     Rate and Rhythm: Normal rate and regular rhythm.     Heart sounds: No murmur heard. Pulmonary:     Effort: Pulmonary effort is normal. No respiratory distress.     Breath sounds: Normal breath sounds. No wheezing or rales.  Musculoskeletal:     Cervical back: Normal range of motion  and neck supple.  Skin:    General: Skin is warm and dry.     Capillary Refill: Capillary refill takes less than 2 seconds.  Neurological:     General: No focal deficit present.     Mental Status: She is alert and oriented to person, place, and time. Mental status is at baseline.  Psychiatric:        Mood and Affect: Mood normal.        Behavior: Behavior normal.        Thought Content: Thought content normal.        Judgment: Judgment normal.     Results for orders placed or performed in visit on 07/07/21  Comp Met (CMET)  Result Value Ref Range   Glucose 100 (H) 70 - 99 mg/dL   BUN 13 6 - 24 mg/dL   Creatinine, Ser 0.70 0.57 - 1.00 mg/dL   eGFR 107 >59 mL/min/1.73   BUN/Creatinine Ratio 19 9 - 23   Sodium 143 134 - 144 mmol/L   Potassium 4.3 3.5 - 5.2 mmol/L   Chloride 104 96 - 106 mmol/L   CO2 26 20 - 29 mmol/L   Calcium 9.4 8.7 - 10.2 mg/dL   Total Protein 6.5 6.0 - 8.5 g/dL   Albumin 4.4 3.8 - 4.8 g/dL   Globulin, Total 2.1 1.5 - 4.5 g/dL   Albumin/Globulin Ratio 2.1 1.2 - 2.2   Bilirubin Total <0.2 0.0 - 1.2 mg/dL   Alkaline Phosphatase 130 (H) 44 - 121 IU/L   AST 19 0 - 40 IU/L   ALT 22 0 - 32 IU/L  Lipid Profile  Result Value Ref Range   Cholesterol, Total 177 100 - 199 mg/dL   Triglycerides 103 0 - 149 mg/dL   HDL 50 >39 mg/dL   VLDL Cholesterol Cal 19 5 - 40 mg/dL   LDL Chol Calc (NIH) 108 (H) 0 - 99 mg/dL   Chol/HDL Ratio 3.5 0.0 - 4.4 ratio  Vitamin D (25 hydroxy)  Result Value Ref Range   Vit D, 25-Hydroxy 44.0 30.0 - 100.0 ng/mL      Assessment & Plan:   Problem List Items Addressed This Visit       Cardiovascular and Mediastinum   Hypertension - Primary    Chronic. Improved since she has not been taking Mobic daily.  Continue with Lisinopril and HCTZ.  Follow up in 3 months for reevaluation.  Call sooner if concerns arise.      Relevant Medications  hydrochlorothiazide (HYDRODIURIL) 25 MG tablet   lisinopril (ZESTRIL) 20 MG tablet      Other   Anxiety    Chronic. Exacerbated lately. Will increase Buspar 10mg BID. Follow up in 3 months for reevaluation. Call sooner if concerns arise.       Relevant Medications   busPIRone (BUSPAR) 10 MG tablet     Follow up plan: Return in about 3 months (around 12/05/2021) for BP Check.      

## 2021-09-04 ENCOUNTER — Encounter: Payer: Self-pay | Admitting: Nurse Practitioner

## 2021-09-04 ENCOUNTER — Ambulatory Visit: Payer: BC Managed Care – PPO | Admitting: Nurse Practitioner

## 2021-09-04 VITALS — BP 121/80 | HR 62 | Temp 98.3°F | Wt 224.8 lb

## 2021-09-04 DIAGNOSIS — I10 Essential (primary) hypertension: Secondary | ICD-10-CM

## 2021-09-04 DIAGNOSIS — F419 Anxiety disorder, unspecified: Secondary | ICD-10-CM

## 2021-09-04 MED ORDER — BUSPIRONE HCL 10 MG PO TABS: 10.0000 mg | ORAL_TABLET | Freq: Two times a day (BID) | ORAL | 1 refills | Status: DC | PRN

## 2021-09-04 MED ORDER — HYDROCHLOROTHIAZIDE 25 MG PO TABS
25.0000 mg | ORAL_TABLET | Freq: Every day | ORAL | 1 refills | Status: DC
Start: 2021-09-04 — End: 2021-12-04

## 2021-09-04 MED ORDER — LISINOPRIL 20 MG PO TABS: 20.0000 mg | ORAL_TABLET | Freq: Every day | ORAL | 1 refills | Status: DC

## 2021-09-04 NOTE — Patient Instructions (Addendum)
Vitamin D 1000IU B12 1000IU

## 2021-09-04 NOTE — Assessment & Plan Note (Signed)
Chronic. Improved since she has not been taking Mobic daily.  Continue with Lisinopril and HCTZ.  Follow up in 3 months for reevaluation.  Call sooner if concerns arise.

## 2021-09-04 NOTE — Assessment & Plan Note (Signed)
Chronic. Exacerbated lately. Will increase Buspar 10mg  BID. Follow up in 3 months for reevaluation. Call sooner if concerns arise.

## 2021-10-06 ENCOUNTER — Other Ambulatory Visit: Payer: BC Managed Care – PPO

## 2021-10-23 ENCOUNTER — Other Ambulatory Visit: Payer: Self-pay | Admitting: Nurse Practitioner

## 2021-10-23 NOTE — Telephone Encounter (Signed)
Requested Prescriptions  Pending Prescriptions Disp Refills  . busPIRone (BUSPAR) 10 MG tablet [Pharmacy Med Name: busPIRone HCl 10 MG Oral Tablet] 60 tablet 0    Sig: TAKE 1 TABLET BY MOUTH TWICE DAILY AS NEEDED (ANXIETY, PANIC)     Psychiatry: Anxiolytics/Hypnotics - Non-controlled Passed - 10/23/2021  9:30 AM      Passed - Valid encounter within last 12 months    Recent Outpatient Visits          1 month ago Primary hypertension   [facility] [name], NP   3 months ago Primary hypertension   [facility] [name], NP   6 months ago Primary hypertension   [facility] [name], NP   7 months ago Annual physical exam   [facility] [name], NP   10 months ago BMI 40.0-44.9, adult [facility])   [facility] [name], NP      Future Appointments            In 1 month [name], NP [facility]

## 2021-11-21 ENCOUNTER — Other Ambulatory Visit: Payer: Self-pay | Admitting: Nurse Practitioner

## 2021-11-25 NOTE — Telephone Encounter (Signed)
Requested Prescriptions  Pending Prescriptions Disp Refills  . busPIRone (BUSPAR) 10 MG tablet [Pharmacy Med Name: busPIRone HCl 10 MG Oral Tablet] 180 tablet 3    Sig: TAKE 1 TABLET BY MOUTH TWICE DAILY AS NEEDED FOR ANXIETY/PANIC     Psychiatry: Anxiolytics/Hypnotics - Non-controlled Passed - 11/21/2021  9:18 AM      Passed - Valid encounter within last 12 months    Recent Outpatient Visits          2 months ago Primary hypertension   [facility] [name], NP   4 months ago Primary hypertension   [facility] [name], NP   7 months ago Primary hypertension   [facility] [name], NP   8 months ago Annual physical exam   [facility] [name], NP   11 months ago BMI 40.0-44.9, adult [facility]   [facility] [name], NP      Future Appointments            In 1 week [name], NP [facility]

## 2021-12-04 ENCOUNTER — Ambulatory Visit: Payer: BC Managed Care – PPO | Admitting: Nurse Practitioner

## 2021-12-04 ENCOUNTER — Encounter: Payer: Self-pay | Admitting: Nurse Practitioner

## 2021-12-04 VITALS — BP 128/79 | HR 76 | Temp 98.6°F | Wt 227.9 lb

## 2021-12-04 DIAGNOSIS — E782 Mixed hyperlipidemia: Secondary | ICD-10-CM | POA: Diagnosis not present

## 2021-12-04 DIAGNOSIS — R3989 Other symptoms and signs involving the genitourinary system: Secondary | ICD-10-CM

## 2021-12-04 DIAGNOSIS — F419 Anxiety disorder, unspecified: Secondary | ICD-10-CM | POA: Diagnosis not present

## 2021-12-04 DIAGNOSIS — I10 Essential (primary) hypertension: Secondary | ICD-10-CM

## 2021-12-04 LAB — URINALYSIS, ROUTINE W REFLEX MICROSCOPIC
Bilirubin, UA: NEGATIVE
Glucose, UA: NEGATIVE
Ketones, UA: NEGATIVE
Leukocytes,UA: NEGATIVE
Nitrite, UA: NEGATIVE
RBC, UA: NEGATIVE
Specific Gravity, UA: 1.02 (ref 1.005–1.030)
Urobilinogen, Ur: 1 mg/dL (ref 0.2–1.0)
pH, UA: 7.5 (ref 5.0–7.5)

## 2021-12-04 MED ORDER — HYDROCHLOROTHIAZIDE 25 MG PO TABS: 25.0000 mg | ORAL_TABLET | Freq: Every day | ORAL | 1 refills | Status: DC

## 2021-12-04 MED ORDER — LISINOPRIL 20 MG PO TABS: 20.0000 mg | ORAL_TABLET | Freq: Every day | ORAL | 1 refills | Status: DC

## 2021-12-04 NOTE — Assessment & Plan Note (Signed)
Chronic.  Controlled.  Continue with current medication regimen.  Labs ordered today.  Return to clinic in 6 months for reevaluation.  Call sooner if concerns arise.  ? ?

## 2021-12-04 NOTE — Assessment & Plan Note (Signed)
Chronic.  Controlled.  Continue with current medication regimen of Lisinopril and HCTZ.  Refills sent today.  Labs ordered today.  Return to clinic in 6 months for reevaluation.  Call sooner if concerns arise.   

## 2021-12-04 NOTE — Assessment & Plan Note (Signed)
Chronic.  Controlled.  Continue with current medication regimen of Buspar daily.  Labs ordered today.  Return to clinic in 6 months for reevaluation.  Call sooner if concerns arise.

## 2021-12-04 NOTE — Progress Notes (Signed)
BP 128/79   Pulse 76   Temp 98.6 F (37 C) (Oral)   Wt 227 lb 14.4 oz (103.4 kg)   LMP  (LMP Unknown)   SpO2 97%   BMI 42.78 kg/m    Subjective:    Patient ID: Kari Brandt, female    DOB: 1970/11/14, 51 y.o.   MRN: [id number]  HPI: Pearson Reasons is a 51 y.o. female  Chief Complaint  Patient presents with   Hypertension    3 month follow up   Patient reports bladder pain. Denies hematuria, dysuria, urinary frequency/urgency.    HYPERTENSION / HYPERLIPIDEMIA Satisfied with current treatment? yes Duration of hypertension: years BP monitoring frequency: rarely BP range: 120/80 BP medication side effects: no Past BP meds: lisinopril-HCTZ Duration of hyperlipidemia: years Cholesterol medication side effects: no Cholesterol supplements: none Past cholesterol medications: none Medication compliance: excellent compliance Aspirin: no Recent stressors: no Recurrent headaches: no Visual changes: no Palpitations: no Dyspnea: no Chest pain: no Lower extremity edema: no Dizzy/lightheaded: no  DEPRESSION/ANXIETY Patient states she has been doing well.  Doing well with anxiety.  Denies concerns at visit today.   [place] Visit from 12/04/2021 in [facility]  PHQ-9 Total Score 1         12/04/2021    1:51 PM 09/04/2021   10:30 AM 07/07/2021   11:01 AM 04/07/2021    9:15 AM  GAD 7 : Generalized Anxiety Score  Nervous, Anxious, on Edge 0 0 0 1  Control/stop worrying 0 0 0 0  Worry too much - different things 0 0 0 1  Trouble relaxing 0 0 0 0  Restless 0 2 0 1  Easily annoyed or irritable 1 1 0 1  Afraid - awful might happen 0 0 0 0  Total GAD 7 Score 1 3 0 4  Anxiety Difficulty Not difficult at all Not difficult at all Not difficult at all Not difficult at all   URINARY SYMPTOMS Started yesterday Dysuria: no Urinary frequency: no Urgency: no Small volume voids: no Symptom severity: no Urinary incontinence: no Foul odor:  no Hematuria: no Abdominal pain: yes Back pain: no Suprapubic pain/pressure: no Flank pain: no Fever:  no Vomiting: no   Relevant past medical, surgical, family and social history reviewed and updated as indicated. Interim medical history since our last visit reviewed. Allergies and medications reviewed and updated.  Review of Systems  Eyes:  Negative for visual disturbance.  Respiratory:  Negative for cough, chest tightness and shortness of breath.   Cardiovascular:  Negative for chest pain, palpitations and leg swelling.  Genitourinary:        Pelvic pain  Neurological:  Negative for dizziness and headaches.  Psychiatric/Behavioral:  Positive for dysphoric mood. Negative for suicidal ideas. The patient is nervous/anxious.     Per HPI unless specifically indicated above     Objective:    BP 128/79   Pulse 76   Temp 98.6 F (37 C) (Oral)   Wt 227 lb 14.4 oz (103.4 kg)   LMP  (LMP Unknown)   SpO2 97%   BMI 42.78 kg/m   Wt Readings from Last 3 Encounters:  12/04/21 227 lb 14.4 oz (103.4 kg)  09/04/21 224 lb 12.8 oz (102 kg)  07/07/21 223 lb 9.6 oz (101.4 kg)    Physical Exam Vitals and nursing note reviewed.  Constitutional:      General: She is not in acute distress.    Appearance: Normal appearance. She  is obese. She is not ill-appearing, toxic-appearing or diaphoretic.  HENT:     Head: Normocephalic.     Right Ear: External ear normal.     Left Ear: External ear normal.     Nose: Nose normal.     Mouth/Throat:     Mouth: Mucous membranes are moist.     Pharynx: Oropharynx is clear.  Eyes:     General:        Right eye: No discharge.        Left eye: No discharge.     Extraocular Movements: Extraocular movements intact.     Conjunctiva/sclera: Conjunctivae normal.     Pupils: Pupils are equal, round, and reactive to light.  Cardiovascular:     Rate and Rhythm: Normal rate and regular rhythm.     Heart sounds: No murmur heard. Pulmonary:     Effort:  Pulmonary effort is normal. No respiratory distress.     Breath sounds: Normal breath sounds. No wheezing or rales.  Abdominal:     General: Abdomen is flat. Bowel sounds are normal. There is no distension.     Palpations: Abdomen is soft.     Tenderness: There is no abdominal tenderness. There is no right CVA tenderness, left CVA tenderness or guarding.  Musculoskeletal:     Cervical back: Normal range of motion and neck supple.  Skin:    General: Skin is warm and dry.     Capillary Refill: Capillary refill takes less than 2 seconds.  Neurological:     General: No focal deficit present.     Mental Status: She is alert and oriented to person, place, and time. Mental status is at baseline.  Psychiatric:        Mood and Affect: Mood normal.        Behavior: Behavior normal.        Thought Content: Thought content normal.        Judgment: Judgment normal.     Results for orders placed or performed in visit on 07/07/21  Comp Met (CMET)  Result Value Ref Range   Glucose 100 (H) 70 - 99 mg/dL   BUN 13 6 - 24 mg/dL   Creatinine, Ser 0.70 0.57 - 1.00 mg/dL   eGFR 107 >59 mL/min/1.73   BUN/Creatinine Ratio 19 9 - 23   Sodium 143 134 - 144 mmol/L   Potassium 4.3 3.5 - 5.2 mmol/L   Chloride 104 96 - 106 mmol/L   CO2 26 20 - 29 mmol/L   Calcium 9.4 8.7 - 10.2 mg/dL   Total Protein 6.5 6.0 - 8.5 g/dL   Albumin 4.4 3.8 - 4.8 g/dL   Globulin, Total 2.1 1.5 - 4.5 g/dL   Albumin/Globulin Ratio 2.1 1.2 - 2.2   Bilirubin Total <0.2 0.0 - 1.2 mg/dL   Alkaline Phosphatase 130 (H) 44 - 121 IU/L   AST 19 0 - 40 IU/L   ALT 22 0 - 32 IU/L  Lipid Profile  Result Value Ref Range   Cholesterol, Total 177 100 - 199 mg/dL   Triglycerides 103 0 - 149 mg/dL   HDL 50 >39 mg/dL   VLDL Cholesterol Cal 19 5 - 40 mg/dL   LDL Chol Calc (NIH) 108 (H) 0 - 99 mg/dL   Chol/HDL Ratio 3.5 0.0 - 4.4 ratio  Vitamin D (25 hydroxy)  Result Value Ref Range   Vit D, 25-Hydroxy 44.0 30.0 - 100.0 ng/mL       Assessment & Plan:  Problem List Items Addressed This Visit       Cardiovascular and Mediastinum   Hypertension - Primary    Chronic.  Controlled.  Continue with current medication regimen of Lisinopril and HCTZ.  Refills sent today.  Labs ordered today.  Return to clinic in 6 months for reevaluation.  Call sooner if concerns arise.        Relevant Medications   hydrochlorothiazide (HYDRODIURIL) 25 MG tablet   lisinopril (ZESTRIL) 20 MG tablet   Other Relevant Orders   Comp Met (CMET)     Other   Anxiety    Chronic.  Controlled.  Continue with current medication regimen of Buspar daily.  Labs ordered today.  Return to clinic in 6 months for reevaluation.  Call sooner if concerns arise.        Mixed hyperlipidemia    Chronic.  Controlled.  Continue with current medication regimen.  Labs ordered today.  Return to clinic in 6 months for reevaluation.  Call sooner if concerns arise.        Relevant Medications   hydrochlorothiazide (HYDRODIURIL) 25 MG tablet   lisinopril (ZESTRIL) 20 MG tablet   Other Relevant Orders   Lipid Profile   Other Visit Diagnoses     Bladder pain       Relevant Orders   Urinalysis, Routine w reflex microscopic        Follow up plan: Return in about 6 months (around 06/04/2022) for Physical and Fasting labs.

## 2021-12-05 ENCOUNTER — Ambulatory Visit: Payer: BC Managed Care – PPO | Admitting: Nurse Practitioner

## 2021-12-05 LAB — LIPID PANEL
Chol/HDL Ratio: 5.6 ratio — ABNORMAL HIGH (ref 0.0–4.4)
Cholesterol, Total: 206 mg/dL — ABNORMAL HIGH (ref 100–199)
HDL: 37 mg/dL — ABNORMAL LOW (ref >39)
LDL Chol Calc (NIH): 117 mg/dL — ABNORMAL HIGH (ref 0–99)
Triglycerides: 300 mg/dL — ABNORMAL HIGH (ref 0–149)
VLDL Cholesterol Cal: 52 mg/dL — ABNORMAL HIGH (ref 5–40)

## 2021-12-05 LAB — COMPREHENSIVE METABOLIC PANEL
ALT: 17 IU/L (ref 0–32)
AST: 14 IU/L (ref 0–40)
Albumin/Globulin Ratio: 1.6 (ref 1.2–2.2)
Albumin: 4.4 g/dL (ref 3.9–4.9)
Alkaline Phosphatase: 129 IU/L — ABNORMAL HIGH (ref 44–121)
BUN/Creatinine Ratio: 15 (ref 9–23)
BUN: 11 mg/dL (ref 6–24)
Bilirubin Total: 0.2 mg/dL (ref 0.0–1.2)
CO2: 28 mmol/L (ref 20–29)
Calcium: 9.4 mg/dL (ref 8.7–10.2)
Chloride: 100 mmol/L (ref 96–106)
Creatinine, Ser: 0.71 mg/dL (ref 0.57–1.00)
Globulin, Total: 2.7 g/dL (ref 1.5–4.5)
Glucose: 127 mg/dL — ABNORMAL HIGH (ref 70–99)
Potassium: 3.8 mmol/L (ref 3.5–5.2)
Sodium: 142 mmol/L (ref 134–144)
Total Protein: 7.1 g/dL (ref 6.0–8.5)
eGFR: 104 mL/min/{1.73_m2} (ref >59)

## 2021-12-05 NOTE — Progress Notes (Signed)
Please let patient know that her lab work shows that her glucose is elevated. I recommend she decrease her carbohydrate intake to help control this.  Cholesterol is elevated from prior. I recommend she follow a low fat diet and exercise. No other concerns at this time.  Follow up as discussed.

## 2022-03-06 ENCOUNTER — Encounter: Payer: Self-pay | Admitting: Podiatry

## 2022-03-06 ENCOUNTER — Ambulatory Visit (INDEPENDENT_AMBULATORY_CARE_PROVIDER_SITE_OTHER): Payer: BC Managed Care – PPO | Admitting: Podiatry

## 2022-03-06 ENCOUNTER — Ambulatory Visit (INDEPENDENT_AMBULATORY_CARE_PROVIDER_SITE_OTHER): Payer: BC Managed Care – PPO

## 2022-03-06 DIAGNOSIS — M76822 Posterior tibial tendinitis, left leg: Secondary | ICD-10-CM | POA: Diagnosis not present

## 2022-03-06 DIAGNOSIS — M79672 Pain in left foot: Secondary | ICD-10-CM | POA: Diagnosis not present

## 2022-03-06 MED ORDER — TRIAMCINOLONE ACETONIDE 10 MG/ML IJ SUSP
10.0000 mg | Freq: Once | INTRAMUSCULAR | Status: AC
  Administered 2022-03-06: 10 mg

## 2022-03-06 MED ORDER — PREDNISONE 10 MG PO TABS: ORAL_TABLET | ORAL | 0 refills | Status: DC

## 2022-03-08 NOTE — Progress Notes (Signed)
Subjective:   Patient ID: Kari Brandt, female   DOB: 51 y.o.   MRN: [id number]   HPI Patient has presented intense discomfort in the medial side of the left ankle with inflammation around the posterior tibial tendon and pain she states has been present for several months.  No other changes to health history and has been present for that period of time   ROS      Objective:  Physical Exam  Neurovascular status was found to be intact muscle strength was found to be adequate range of motion adequate with inflammation fluid around the posterior tibial tendon as it comes under the medial malleolus and gets near its insertion into the navicular with no indications of tendon damage or dysfunction at the current time.  Arch height is moderately flattened but not different than the other foot good digital perfusion well-oriented     Assessment:  Acute posterior tibial tendinitis left with inflammation fluid noted     Plan:  H&P reviewed condition and discussed posterior tibial tendinitis relationship to her arch and at this point did sterile prep injected the sheath of the tendon 3 mg dexamethasone Kenalog 5 g Xylocaine after explaining risk and then applied air fracture walker to completely immobilize and allow the tendon to rest.  Reappoint 2 weeks may require MRI or other treatment depending on response and good chance this will require orthotic treatment  X-rays indicate moderate depression of the arch with some enlargement of the navicular bone left no indications of fracture

## 2022-03-20 ENCOUNTER — Ambulatory Visit (INDEPENDENT_AMBULATORY_CARE_PROVIDER_SITE_OTHER): Payer: BC Managed Care – PPO | Admitting: Podiatry

## 2022-03-20 ENCOUNTER — Encounter: Payer: Self-pay | Admitting: Podiatry

## 2022-03-20 DIAGNOSIS — M76822 Posterior tibial tendinitis, left leg: Secondary | ICD-10-CM | POA: Diagnosis not present

## 2022-03-20 MED ORDER — TRIAMCINOLONE ACETONIDE 10 MG/ML IJ SUSP
10.0000 mg | Freq: Once | INTRAMUSCULAR | Status: AC
  Administered 2022-03-20: 10 mg

## 2022-03-20 NOTE — Progress Notes (Signed)
Subjective:   Patient ID: Kari Brandt, female   DOB: 51 y.o.   MRN: [id number]   HPI Patient presents stating that her left foot is still hurting her son some improvement and she is going to start wearing orthotics and still wearing her boot   ROS      Objective:  Physical Exam  Status intact with inflammation posterior tibial tendon left as it inserts navicular mild depression of the arch     Assessment:  Posterior tibial tendinitis left still present improved from previous visit still though noticeable     Plan:  H&P reviewed and recommended continued boot usage for several more weeks with gradual orthotics and because there is 1 area still sore I did do an injection after explaining risk sterile prep 3 mg dexamethasone Kenalog sheath the posterior tibial tendon and reappoint as needed

## 2022-05-27 ENCOUNTER — Encounter: Payer: Self-pay | Admitting: Nurse Practitioner

## 2022-06-04 ENCOUNTER — Encounter: Payer: Self-pay | Admitting: Nurse Practitioner

## 2022-06-04 ENCOUNTER — Ambulatory Visit (INDEPENDENT_AMBULATORY_CARE_PROVIDER_SITE_OTHER): Payer: BC Managed Care – PPO | Admitting: Nurse Practitioner

## 2022-06-04 VITALS — BP 170/106 | HR 74 | Temp 97.9°F | Ht 62.1 in | Wt 235.3 lb

## 2022-06-04 DIAGNOSIS — Z1231 Encounter for screening mammogram for malignant neoplasm of breast: Secondary | ICD-10-CM

## 2022-06-04 DIAGNOSIS — E559 Vitamin D deficiency, unspecified: Secondary | ICD-10-CM | POA: Diagnosis not present

## 2022-06-04 DIAGNOSIS — R7309 Other abnormal glucose: Secondary | ICD-10-CM

## 2022-06-04 DIAGNOSIS — Z0000 Encounter for general adult medical examination without abnormal findings: Secondary | ICD-10-CM

## 2022-06-04 DIAGNOSIS — I10 Essential (primary) hypertension: Secondary | ICD-10-CM

## 2022-06-04 DIAGNOSIS — R109 Unspecified abdominal pain: Secondary | ICD-10-CM

## 2022-06-04 DIAGNOSIS — E782 Mixed hyperlipidemia: Secondary | ICD-10-CM

## 2022-06-04 DIAGNOSIS — Z6841 Body Mass Index (BMI) 40.0 and over, adult: Secondary | ICD-10-CM | POA: Diagnosis not present

## 2022-06-04 DIAGNOSIS — Z1211 Encounter for screening for malignant neoplasm of colon: Secondary | ICD-10-CM

## 2022-06-04 LAB — URINALYSIS, ROUTINE W REFLEX MICROSCOPIC
Bilirubin, UA: NEGATIVE
Glucose, UA: NEGATIVE
Ketones, UA: NEGATIVE
Leukocytes,UA: NEGATIVE
Nitrite, UA: NEGATIVE
Protein,UA: NEGATIVE
RBC, UA: NEGATIVE
Specific Gravity, UA: 1.02 (ref 1.005–1.030)
Urobilinogen, Ur: 0.2 mg/dL (ref 0.2–1.0)
pH, UA: 6 (ref 5.0–7.5)

## 2022-06-04 MED ORDER — LISINOPRIL 40 MG PO TABS: 40.0000 mg | ORAL_TABLET | Freq: Every day | ORAL | 1 refills | Status: DC

## 2022-06-04 NOTE — Assessment & Plan Note (Signed)
Chronic. Not well controlled. Will increase Lisinopril to '40mg'$  daily.  Labs ordered today.  Follow up in 1 month.  Call sooner if concerns arise.

## 2022-06-04 NOTE — Assessment & Plan Note (Signed)
Recommended eating smaller high protein, low fat meals more frequently and exercising 30 mins a day 5 times a week with a goal of 10-15lb weight loss in the next 3 months.  

## 2022-06-04 NOTE — Patient Instructions (Signed)
Please call to schedule your mammogram and/or bone density: [facility]  Address: [address], [place], [place] [address] Phone: [phone]  [facility] [place] [address] [place],  [place]  [address] Phone: [phone]   

## 2022-06-04 NOTE — Assessment & Plan Note (Signed)
Labs ordered at visit today.  Will make recommendations based on lab results.   

## 2022-06-04 NOTE — Progress Notes (Signed)
BP (!) 170/106 (BP Location: Right Arm, Cuff Size: Large)   Pulse 74   Temp 97.9 F (36.6 C) (Oral)   Ht 5' 2.1" (1.577 m)   Wt 235 lb 4.8 oz (106.7 kg)   LMP 03/31/2022 (Approximate)   SpO2 98%   BMI 42.90 kg/m    Subjective:    Patient ID: Kari Brandt, female    DOB: 03/29/1970, 52 y.o.   MRN: [id number]  HPI: Kari Brandt is a 52 y.o. female presenting on 06/04/2022 for comprehensive medical examination. Current medical complaints include: nipple sensitivity  She currently lives with: her husband Menopausal Symptoms: yes  Patient states she is having very sore nipples.  And every night she has bad cramping that feels like she is about to start her period.  This has been going on for about a month.  She had a period in January and before that lasted a few days.  Her last period before that was August.     HYPERTENSION / [place] Satisfied with current treatment? yes Duration of hypertension: years BP monitoring frequency: not checking BP range:  BP medication side effects: no Past BP meds: lisinopril Duration of hyperlipidemia: years Cholesterol medication side effects: no Cholesterol supplements: none Past cholesterol medications: none Medication compliance: excellent compliance Aspirin: no Recent stressors: no Recurrent headaches: no Visual changes: no Palpitations: no Dyspnea: no Chest pain: no Lower extremity edema: no Dizzy/lightheaded: no   Depression Screen done today and results listed below:     06/04/2022    8:15 AM 12/04/2021    1:51 PM 09/04/2021   10:30 AM 07/07/2021   11:01 AM 04/07/2021    9:15 AM  Depression screen PHQ 2/9  Decreased Interest 0 0 0 0 1  Down, Depressed, Hopeless 1 0 0 1 0  PHQ - 2 Score 1 0 0 1 1  Altered sleeping 1 0 '1 1 1  '$ Tired, decreased energy 1 1 0 1 1  Change in appetite 0 0 0 0 0  Feeling bad or failure about yourself  0 0 0 0 0  Trouble concentrating 0 0 0 0 0  Moving slowly or  fidgety/restless 0 0 0 0 0  Suicidal thoughts 0 0 0 0 0  PHQ-9 Score '3 1 1 3 3  '$ Difficult doing work/chores Not difficult at all Not difficult at all Not difficult at all Somewhat difficult Not difficult at all    The patient does not have a history of falls. I did complete a risk assessment for falls. A plan of care for falls was documented.   Past Medical History:  Past Medical History:  Diagnosis Date   Anxiety    GERD (gastroesophageal reflux disease)    Hypertension     Surgical History:  Past Surgical History:  Procedure Laterality Date   ENDOMETRIAL ABLATION  2007   her option   TUBAL LIGATION     WRIST SURGERY      Medications:  Current Outpatient Medications on File Prior to Visit  Medication Sig   Ascorbic Acid (VITAMIN C) 1000 MG tablet Take 1,000 mg by mouth daily.   ASPIRIN 81 PO Take 1 tablet by mouth daily.   busPIRone (BUSPAR) 10 MG tablet TAKE 1 TABLET BY MOUTH TWICE DAILY AS NEEDED FOR ANXIETY/PANIC   hydrochlorothiazide (HYDRODIURIL) 25 MG tablet Take 1 tablet (25 mg total) by mouth daily. for high blood pressure   VITAMIN D PO Take 500 mg by mouth daily.   No  current facility-administered medications on file prior to visit.    Allergies:  No Known Allergies  Social History:  Social History   Socioeconomic History   Marital status: Married    Spouse name: Not on file   Number of children: Not on file   Years of education: Not on file   Highest education level: Not on file  Occupational History   Not on file  Tobacco Use   Smoking status: Never   Smokeless tobacco: Never  Vaping Use   Vaping Use: Never used  Substance and Sexual Activity   Alcohol use: Yes    Comment: occassionally   Drug use: Never   Sexual activity: Yes    Birth control/protection: Surgical    Comment: Tubal Ligation  Other Topics Concern   Not on file  Social History Narrative   Not on file   Social Determinants of Health   Financial Resource Strain: Not on  file  Food Insecurity: Not on file  Transportation Needs: Not on file  Physical Activity: Not on file  Stress: Not on file  Social Connections: Not on file  Intimate Partner Violence: Not on file   Social History   Tobacco Use  Smoking Status Never  Smokeless Tobacco Never   Social History   Substance and Sexual Activity  Alcohol Use Yes   Comment: occassionally    Family History:  Family History  Problem Relation Age of Onset   Diabetes Mother    Heart disease Mother    Kidney disease Mother    Anxiety disorder Sister    Heart disease Maternal Grandmother    Breast cancer Neg Hx     Past medical history, surgical history, medications, allergies, family history and social history reviewed with patient today and changes made to appropriate areas of the chart.   Review of Systems  Eyes:  Negative for blurred vision and double vision.  Respiratory:  Negative for shortness of breath.   Cardiovascular:  Negative for chest pain, palpitations and leg swelling.  Genitourinary:        Abdominal cramping  Neurological:  Negative for dizziness and headaches.  Endo/Heme/Allergies:        Sensitive nipples   All other ROS negative except what is listed above and in the HPI.      Objective:    BP (!) 170/106 (BP Location: Right Arm, Cuff Size: Large)   Pulse 74   Temp 97.9 F (36.6 C) (Oral)   Ht 5' 2.1" (1.577 m)   Wt 235 lb 4.8 oz (106.7 kg)   LMP 03/31/2022 (Approximate)   SpO2 98%   BMI 42.90 kg/m   Wt Readings from Last 3 Encounters:  06/04/22 235 lb 4.8 oz (106.7 kg)  12/04/21 227 lb 14.4 oz (103.4 kg)  09/04/21 224 lb 12.8 oz (102 kg)    Physical Exam Vitals and nursing note reviewed. Exam conducted with a chaperone present Yvonna Alanis, [place]).  Constitutional:      General: She is awake. She is not in acute distress.    Appearance: Normal appearance. She is well-developed. She is obese. She is not ill-appearing.  HENT:     Head: Normocephalic and  atraumatic.     Right Ear: Hearing, tympanic membrane, ear canal and external ear normal. No drainage.     Left Ear: Hearing, tympanic membrane, ear canal and external ear normal. No drainage.     Nose: Nose normal.     Right Sinus: No maxillary sinus tenderness or  frontal sinus tenderness.     Left Sinus: No maxillary sinus tenderness or frontal sinus tenderness.     Mouth/Throat:     Mouth: Mucous membranes are moist.     Pharynx: Oropharynx is clear. Uvula midline. No pharyngeal swelling, oropharyngeal exudate or posterior oropharyngeal erythema.  Eyes:     General: Lids are normal.        Right eye: No discharge.        Left eye: No discharge.     Extraocular Movements: Extraocular movements intact.     Conjunctiva/sclera: Conjunctivae normal.     Pupils: Pupils are equal, round, and reactive to light.     Visual Fields: Right eye visual fields normal and left eye visual fields normal.  Neck:     Thyroid: No thyromegaly.     Vascular: No carotid bruit.     Trachea: Trachea normal.  Cardiovascular:     Rate and Rhythm: Normal rate and regular rhythm.     Heart sounds: Normal heart sounds. No murmur heard.    No gallop.  Pulmonary:     Effort: Pulmonary effort is normal. No accessory muscle usage or respiratory distress.     Breath sounds: Normal breath sounds.  Chest:  Breasts:    Right: Normal. No swelling, bleeding, inverted nipple, mass, nipple discharge, skin change or tenderness.     Left: Normal. No swelling, bleeding, inverted nipple, mass, nipple discharge, skin change or tenderness.  Abdominal:     General: Bowel sounds are normal.     Palpations: Abdomen is soft. There is no hepatomegaly or splenomegaly.     Tenderness: There is no abdominal tenderness.  Musculoskeletal:        General: Normal range of motion.     Cervical back: Normal range of motion and neck supple.     Right lower leg: No edema.     Left lower leg: No edema.  Lymphadenopathy:     Head:      Right side of head: No submental, submandibular, tonsillar, preauricular or posterior auricular adenopathy.     Left side of head: No submental, submandibular, tonsillar, preauricular or posterior auricular adenopathy.     Cervical: No cervical adenopathy.     Upper Body:     Right upper body: No supraclavicular, axillary or pectoral adenopathy.     Left upper body: No supraclavicular, axillary or pectoral adenopathy.  Skin:    General: Skin is warm and dry.     Capillary Refill: Capillary refill takes less than 2 seconds.     Findings: No rash.  Neurological:     Mental Status: She is alert and oriented to person, place, and time.     Gait: Gait is intact.  Psychiatric:        Attention and Perception: Attention normal.        Mood and Affect: Mood normal.        Speech: Speech normal.        Behavior: Behavior normal. Behavior is cooperative.        Thought Content: Thought content normal.        Judgment: Judgment normal.     Results for orders placed or performed in visit on 12/04/21  Comp Met (CMET)  Result Value Ref Range   Glucose 127 (H) 70 - 99 mg/dL   BUN 11 6 - 24 mg/dL   Creatinine, Ser 0.71 0.57 - 1.00 mg/dL   eGFR 104 >59 mL/min/1.73   BUN/Creatinine Ratio 15 9 -  23   Sodium 142 134 - 144 mmol/L   Potassium 3.8 3.5 - 5.2 mmol/L   Chloride 100 96 - 106 mmol/L   CO2 28 20 - 29 mmol/L   Calcium 9.4 8.7 - 10.2 mg/dL   Total Protein 7.1 6.0 - 8.5 g/dL   Albumin 4.4 3.9 - 4.9 g/dL   Globulin, Total 2.7 1.5 - 4.5 g/dL   Albumin/Globulin Ratio 1.6 1.2 - 2.2   Bilirubin Total 0.2 0.0 - 1.2 mg/dL   Alkaline Phosphatase 129 (H) 44 - 121 IU/L   AST 14 0 - 40 IU/L   ALT 17 0 - 32 IU/L  Lipid Profile  Result Value Ref Range   Cholesterol, Total 206 (H) 100 - 199 mg/dL   Triglycerides 300 (H) 0 - 149 mg/dL   HDL 37 (L) >39 mg/dL   VLDL Cholesterol Cal 52 (H) 5 - 40 mg/dL   LDL Chol Calc (NIH) 117 (H) 0 - 99 mg/dL   Chol/HDL Ratio 5.6 (H) 0.0 - 4.4 ratio  Urinalysis,  Routine w reflex microscopic  Result Value Ref Range   Specific Gravity, UA 1.020 1.005 - 1.030   pH, UA 7.5 5.0 - 7.5   Color, UA Yellow Yellow   Appearance Ur Clear Clear   Leukocytes,UA Negative Negative   Protein,UA Trace (A) Negative/Trace   Glucose, UA Negative Negative   Ketones, UA Negative Negative   RBC, UA Negative Negative   Bilirubin, UA Negative Negative   Urobilinogen, Ur 1.0 0.2 - 1.0 mg/dL   Nitrite, UA Negative Negative      Assessment & Plan:   Problem List Items Addressed This Visit       Cardiovascular and Mediastinum   Hypertension    Chronic. Not well controlled. Will increase Lisinopril to '40mg'$  daily.  Labs ordered today.  Follow up in 1 month.  Call sooner if concerns arise.       Relevant Medications   lisinopril (ZESTRIL) 40 MG tablet   Other Relevant Orders   Comprehensive metabolic panel     Other   BMI 40.0-44.9, adult (HCC)    Recommended eating smaller high protein, low fat meals more frequently and exercising 30 mins a day 5 times a week with a goal of 10-15lb weight loss in the next 3 months.       Vitamin D deficiency    Labs ordered at visit today.  Will make recommendations based on lab results.        Relevant Orders   Vitamin D (25 hydroxy)   Mixed hyperlipidemia    Labs ordered at visit today.  Will make recommendations based on lab results.        Relevant Medications   lisinopril (ZESTRIL) 40 MG tablet   Other Relevant Orders   Lipid panel   Other Visit Diagnoses     Annual physical exam    -  Primary   Health maintenance reviewed during visit today.  Labs ordered.  Vaccines up to date.  Mammogram ordered. PAP up to date. Cologuard ordered.   Relevant Orders   CBC with Differential/Platelet   Comprehensive metabolic panel   Lipid panel   TSH   Urinalysis, Routine w reflex microscopic   Vitamin D (25 hydroxy)   Abdominal cramping       Ongoing x 1 month. Not associated with vaginal bleeding. Will order [place] to  evaluate symptoms. Will make recommendations based on imaging.   Relevant Orders   US Pelvic Complete With  Transvaginal   Encounter for screening mammogram for malignant neoplasm of breast       Relevant Orders   MM 3D SCREENING MAMMOGRAM BILATERAL BREAST   Screening for colon cancer       Relevant Orders   Cologuard        Follow up plan: Return in about 1 month (around 07/05/2022) for BP Check.   LABORATORY TESTING:  - Pap smear: up to date  IMMUNIZATIONS:   - Tdap: Tetanus vaccination status reviewed: last tetanus booster within 10 years. - Influenza: Postponed to flu season - Pneumovax: Not applicable - Prevnar: Not applicable - COVID: Not applicable - HPV: Not applicable - Shingrix vaccine: Not applicable  SCREENING: -Mammogram: Ordered today  - Colonoscopy:  Cologuard ordered    - Bone Density: Not applicable  -Hearing Test: Not applicable  -Spirometry: Not applicable   PATIENT COUNSELING:   Advised to take 1 mg of folate supplement per day if capable of pregnancy.   Sexuality: Discussed sexually transmitted diseases, partner selection, use of condoms, avoidance of unintended pregnancy  and contraceptive alternatives.   Advised to avoid cigarette smoking.  I discussed with the patient that most people either abstain from alcohol or drink within safe limits (<=14/week and <=4 drinks/occasion for males, <=7/weeks and <= 3 drinks/occasion for females) and that the risk for alcohol disorders and other health effects rises proportionally with the number of drinks per week and how often a drinker exceeds daily limits.  Discussed cessation/primary prevention of drug use and availability of treatment for abuse.   Diet: Encouraged to adjust caloric intake to maintain  or achieve ideal body weight, to reduce intake of dietary saturated fat and total fat, to limit sodium intake by avoiding high sodium foods and not adding table salt, and to maintain adequate dietary potassium  and calcium preferably from fresh fruits, vegetables, and low-fat dairy products.    stressed the importance of regular exercise  Injury prevention: Discussed safety belts, safety helmets, smoke detector, smoking near bedding or upholstery.   Dental health: Discussed importance of regular tooth brushing, flossing, and dental visits.    NEXT PREVENTATIVE PHYSICAL DUE IN 1 YEAR. Return in about 1 month (around 07/05/2022) for BP Check.

## 2022-06-05 ENCOUNTER — Telehealth: Payer: Self-pay | Admitting: Nurse Practitioner

## 2022-06-05 LAB — CBC WITH DIFFERENTIAL/PLATELET
Basophils Absolute: 0 10*3/uL (ref 0.0–0.2)
Basos: 1 %
EOS (ABSOLUTE): 0.2 10*3/uL (ref 0.0–0.4)
Eos: 3 %
Hematocrit: 40.1 % (ref 34.0–46.6)
Hemoglobin: 13.1 g/dL (ref 11.1–15.9)
Immature Grans (Abs): 0 10*3/uL (ref 0.0–0.1)
Immature Granulocytes: 0 %
Lymphocytes Absolute: 2.1 10*3/uL (ref 0.7–3.1)
Lymphs: 30 %
MCH: 29.8 pg (ref 26.6–33.0)
MCHC: 32.7 g/dL (ref 31.5–35.7)
MCV: 91 fL (ref 79–97)
Monocytes Absolute: 0.5 10*3/uL (ref 0.1–0.9)
Monocytes: 7 %
Neutrophils Absolute: 4.2 10*3/uL (ref 1.4–7.0)
Neutrophils: 59 %
Platelets: 349 10*3/uL (ref 150–450)
RBC: 4.39 x10E6/uL (ref 3.77–5.28)
RDW: 13.2 % (ref 11.7–15.4)
WBC: 7 10*3/uL (ref 3.4–10.8)

## 2022-06-05 LAB — LIPID PANEL
Chol/HDL Ratio: 4.7 ratio — ABNORMAL HIGH (ref 0.0–4.4)
Cholesterol, Total: 197 mg/dL (ref 100–199)
HDL: 42 mg/dL (ref >39)
LDL Chol Calc (NIH): 124 mg/dL — ABNORMAL HIGH (ref 0–99)
Triglycerides: 173 mg/dL — ABNORMAL HIGH (ref 0–149)
VLDL Cholesterol Cal: 31 mg/dL (ref 5–40)

## 2022-06-05 LAB — COMPREHENSIVE METABOLIC PANEL
ALT: 17 IU/L (ref 0–32)
AST: 17 IU/L (ref 0–40)
Albumin/Globulin Ratio: 1.8 (ref 1.2–2.2)
Albumin: 4.4 g/dL (ref 3.9–4.9)
Alkaline Phosphatase: 131 IU/L — ABNORMAL HIGH (ref 44–121)
BUN/Creatinine Ratio: 18 (ref 9–23)
BUN: 14 mg/dL (ref 6–24)
Bilirubin Total: 0.3 mg/dL (ref 0.0–1.2)
CO2: 23 mmol/L (ref 20–29)
Calcium: 9.3 mg/dL (ref 8.7–10.2)
Chloride: 103 mmol/L (ref 96–106)
Creatinine, Ser: 0.78 mg/dL (ref 0.57–1.00)
Globulin, Total: 2.4 g/dL (ref 1.5–4.5)
Glucose: 115 mg/dL — ABNORMAL HIGH (ref 70–99)
Potassium: 4.2 mmol/L (ref 3.5–5.2)
Sodium: 142 mmol/L (ref 134–144)
Total Protein: 6.8 g/dL (ref 6.0–8.5)
eGFR: 93 mL/min/{1.73_m2} (ref >59)

## 2022-06-05 LAB — TSH: TSH: 1.45 u[IU]/mL (ref 0.450–4.500)

## 2022-06-05 LAB — VITAMIN D 25 HYDROXY (VIT D DEFICIENCY, FRACTURES): Vit D, 25-Hydroxy: 41.3 ng/mL (ref 30.0–100.0)

## 2022-06-05 NOTE — Telephone Encounter (Signed)
Pt. Given lab results and instructions, verbalizes understanding. 

## 2022-06-05 NOTE — Addendum Note (Signed)
Addended by: Jon Billings on: 06/05/2022 08:13 AM   Modules accepted: Orders

## 2022-06-05 NOTE — Progress Notes (Signed)
Please let patient know that overall her lab work looks good.  Her glucose is elevated, I have added on a measure of diabetes and once that returns I will let her know what the result is.  Her cholesterol has improved from prior.  Continue with a low fat diet and exercise.  No other concerns at this time.  Her vitamin D is within normal range.  No need for further supplement.

## 2022-06-08 ENCOUNTER — Ambulatory Visit
Admission: RE | Admit: 2022-06-08 | Discharge: 2022-06-08 | Disposition: A | Discharge status: Home / Self Care | Payer: BC Managed Care – PPO | Source: Ambulatory Visit | Attending: Nurse Practitioner | Admitting: Nurse Practitioner

## 2022-06-08 DIAGNOSIS — R109 Unspecified abdominal pain: Secondary | ICD-10-CM | POA: Insufficient documentation

## 2022-06-08 DIAGNOSIS — N920 Excessive and frequent menstruation with regular cycle: Secondary | ICD-10-CM | POA: Diagnosis not present

## 2022-06-09 NOTE — Progress Notes (Signed)
Hi Kari Brandt.  Your ultrasound was normal.  I believe your symptoms are hormonal related.  Please let me know if you have any other questions.

## 2022-07-03 DIAGNOSIS — Z1211 Encounter for screening for malignant neoplasm of colon: Secondary | ICD-10-CM | POA: Diagnosis not present

## 2022-07-06 ENCOUNTER — Ambulatory Visit: Payer: BC Managed Care – PPO | Admitting: Nurse Practitioner

## 2022-07-06 ENCOUNTER — Encounter: Payer: Self-pay | Admitting: Nurse Practitioner

## 2022-07-06 DIAGNOSIS — I10 Essential (primary) hypertension: Secondary | ICD-10-CM

## 2022-07-06 DIAGNOSIS — R7309 Other abnormal glucose: Secondary | ICD-10-CM

## 2022-07-06 NOTE — Progress Notes (Signed)
BP 125/84 (BP Location: Left Arm, Cuff Size: Large)   Pulse 73   Temp 98.3 F (36.8 C) (Oral)   Wt 235 lb 3.2 oz (106.7 kg)   SpO2 96%   BMI 42.88 kg/m    Subjective:    Patient ID: Kari Brandt, female    DOB: 07-Sep-1970, 52 y.o.   MRN: [id number]  HPI: Kari Brandt is a 52 y.o. female  Chief Complaint  Patient presents with   Hypertension   HYPERTENSION without Chronic Kidney Disease Hypertension status: controlled  Satisfied with current treatment? yes Duration of hypertension: years BP monitoring frequency:  daily BP range: 120-130/80 BP medication side effects:  no Medication compliance: excellent compliance Previous BP meds:HCTZ and lisinopril Aspirin: no Recurrent headaches: no Visual changes: no Palpitations: no Dyspnea: no Chest pain: no Lower extremity edema: no Dizzy/lightheaded: no  Relevant past medical, surgical, family and social history reviewed and updated as indicated. Interim medical history since our last visit reviewed. Allergies and medications reviewed and updated.  Review of Systems  Eyes:  Negative for visual disturbance.  Respiratory:  Negative for cough, chest tightness and shortness of breath.   Cardiovascular:  Negative for chest pain, palpitations and leg swelling.  Neurological:  Negative for dizziness and headaches.    Per HPI unless specifically indicated above     Objective:    BP 125/84 (BP Location: Left Arm, Cuff Size: Large)   Pulse 73   Temp 98.3 F (36.8 C) (Oral)   Wt 235 lb 3.2 oz (106.7 kg)   SpO2 96%   BMI 42.88 kg/m   Wt Readings from Last 3 Encounters:  07/06/22 235 lb 3.2 oz (106.7 kg)  06/04/22 235 lb 4.8 oz (106.7 kg)  12/04/21 227 lb 14.4 oz (103.4 kg)    Physical Exam Vitals and nursing note reviewed.  Constitutional:      General: She is not in acute distress.    Appearance: Normal appearance. She is not ill-appearing, toxic-appearing or diaphoretic.  HENT:     Head:  Normocephalic.     Right Ear: External ear normal.     Left Ear: External ear normal.     Nose: Nose normal.     Mouth/Throat:     Mouth: Mucous membranes are moist.     Pharynx: Oropharynx is clear.  Eyes:     General:        Right eye: No discharge.        Left eye: No discharge.     Extraocular Movements: Extraocular movements intact.     Conjunctiva/sclera: Conjunctivae normal.     Pupils: Pupils are equal, round, and reactive to light.  Cardiovascular:     Rate and Rhythm: Normal rate and regular rhythm.     Heart sounds: No murmur heard. Pulmonary:     Effort: Pulmonary effort is normal. No respiratory distress.     Breath sounds: Normal breath sounds. No wheezing or rales.  Musculoskeletal:     Cervical back: Normal range of motion and neck supple.  Skin:    General: Skin is warm and dry.     Capillary Refill: Capillary refill takes less than 2 seconds.  Neurological:     General: No focal deficit present.     Mental Status: She is alert and oriented to person, place, and time. Mental status is at baseline.  Psychiatric:        Mood and Affect: Mood normal.        Behavior:  Behavior normal.        Thought Content: Thought content normal.        Judgment: Judgment normal.     Results for orders placed or performed in visit on 06/04/22  CBC with Differential/Platelet  Result Value Ref Range   WBC 7.0 3.4 - 10.8 x10E3/uL   RBC 4.39 3.77 - 5.28 x10E6/uL   Hemoglobin 13.1 11.1 - 15.9 g/dL   Hematocrit 16.1 09.6 - 46.6 %   MCV 91 79 - 97 fL   MCH 29.8 26.6 - 33.0 pg   MCHC 32.7 31.5 - 35.7 g/dL   RDW 04.5 40.9 - 81.1 %   Platelets 349 150 - 450 x10E3/uL   Neutrophils 59 Not Estab. %   Lymphs 30 Not Estab. %   Monocytes 7 Not Estab. %   Eos 3 Not Estab. %   Basos 1 Not Estab. %   Neutrophils Absolute 4.2 1.4 - 7.0 x10E3/uL   Lymphocytes Absolute 2.1 0.7 - 3.1 x10E3/uL   Monocytes Absolute 0.5 0.1 - 0.9 x10E3/uL   EOS (ABSOLUTE) 0.2 0.0 - 0.4 x10E3/uL    Basophils Absolute 0.0 0.0 - 0.2 x10E3/uL   Immature Granulocytes 0 Not Estab. %   Immature Grans (Abs) 0.0 0.0 - 0.1 x10E3/uL  Comprehensive metabolic panel  Result Value Ref Range   Glucose 115 (H) 70 - 99 mg/dL   BUN 14 6 - 24 mg/dL   Creatinine, Ser 9.14 0.57 - 1.00 mg/dL   eGFR 93 >78 GN/FAO/1.30   BUN/Creatinine Ratio 18 9 - 23   Sodium 142 134 - 144 mmol/L   Potassium 4.2 3.5 - 5.2 mmol/L   Chloride 103 96 - 106 mmol/L   CO2 23 20 - 29 mmol/L   Calcium 9.3 8.7 - 10.2 mg/dL   Total Protein 6.8 6.0 - 8.5 g/dL   Albumin 4.4 3.9 - 4.9 g/dL   Globulin, Total 2.4 1.5 - 4.5 g/dL   Albumin/Globulin Ratio 1.8 1.2 - 2.2   Bilirubin Total 0.3 0.0 - 1.2 mg/dL   Alkaline Phosphatase 131 (H) 44 - 121 IU/L   AST 17 0 - 40 IU/L   ALT 17 0 - 32 IU/L  Lipid panel  Result Value Ref Range   Cholesterol, Total 197 100 - 199 mg/dL   Triglycerides 865 (H) 0 - 149 mg/dL   HDL 42 >78 mg/dL   VLDL Cholesterol Cal 31 5 - 40 mg/dL   LDL Chol Calc (NIH) 469 (H) 0 - 99 mg/dL   Chol/HDL Ratio 4.7 (H) 0.0 - 4.4 ratio  TSH  Result Value Ref Range   TSH 1.450 0.450 - 4.500 uIU/mL  Urinalysis, Routine w reflex microscopic  Result Value Ref Range   Specific Gravity, UA 1.020 1.005 - 1.030   pH, UA 6.0 5.0 - 7.5   Color, UA Yellow Yellow   Appearance Ur Clear Clear   Leukocytes,UA Negative Negative   Protein,UA Negative Negative/Trace   Glucose, UA Negative Negative   Ketones, UA Negative Negative   RBC, UA Negative Negative   Bilirubin, UA Negative Negative   Urobilinogen, Ur 0.2 0.2 - 1.0 mg/dL   Nitrite, UA Negative Negative   Microscopic Examination Comment   Vitamin D (25 hydroxy)  Result Value Ref Range   Vit D, 25-Hydroxy 41.3 30.0 - 100.0 ng/mL      Assessment & Plan:   Problem List Items Addressed This Visit       Cardiovascular and Mediastinum   Hypertension  Chronic. Controlled.  Continue with Lisinopril 40mg  and HCTZ 25mg  daily.  Follow up in 3 months.  Call sooner if  concerns arise.         Other   Morbid obesity - Primary    Secondary to HTN and HLD. Recommended eating smaller high protein, low fat meals more frequently and exercising 30 mins a day 5 times a week with a goal of 10-15lb weight loss in the next 3 months.       Other Visit Diagnoses     Elevated glucose       A1c checked at visit today. Will make recommendations based on lab results.   Relevant Orders   HgB A1c        Follow up plan: Return in about 3 months (around 10/05/2022) for HTN, HLD, DM2 FU.

## 2022-07-06 NOTE — Assessment & Plan Note (Signed)
Chronic. Controlled.  Continue with Lisinopril 40mg  and HCTZ 25mg  daily.  Follow up in 3 months.  Call sooner if concerns arise.

## 2022-07-06 NOTE — Assessment & Plan Note (Signed)
Secondary to HTN and HLD. Recommended eating smaller high protein, low fat meals more frequently and exercising 30 mins a day 5 times a week with a goal of 10-15lb weight loss in the next 3 months.

## 2022-07-07 LAB — HEMOGLOBIN A1C
Est. average glucose Bld gHb Est-mCnc: 140 mg/dL
Hgb A1c MFr Bld: 6.5 % — ABNORMAL HIGH (ref 4.8–5.6)

## 2022-07-07 NOTE — Progress Notes (Signed)
Please let patient know that her lab work shows that her A1c is in the diabetic range.  I recommend she follow a low carb diet and exercise.  We will recheck this at her next visit to confirm the diagnosis of diabetes.  If she has other questions, please let me know.

## 2022-07-10 LAB — COLOGUARD: COLOGUARD: NEGATIVE

## 2022-07-13 NOTE — Progress Notes (Signed)
Hi Becky. Your Cologuard test was negative.

## 2022-09-15 ENCOUNTER — Encounter: Payer: Self-pay | Admitting: Nurse Practitioner

## 2022-09-15 ENCOUNTER — Ambulatory Visit: Payer: Self-pay

## 2022-09-15 ENCOUNTER — Ambulatory Visit: Payer: BC Managed Care – PPO | Admitting: Nurse Practitioner

## 2022-09-15 VITALS — BP 125/80 | HR 81 | Temp 98.7°F | Wt 229.0 lb

## 2022-09-15 DIAGNOSIS — M542 Cervicalgia: Secondary | ICD-10-CM | POA: Diagnosis not present

## 2022-09-15 DIAGNOSIS — I10 Essential (primary) hypertension: Secondary | ICD-10-CM | POA: Diagnosis not present

## 2022-09-15 MED ORDER — CYCLOBENZAPRINE HCL 10 MG PO TABS: 10.0000 mg | ORAL_TABLET | Freq: Every day | ORAL | 0 refills | Status: DC

## 2022-09-15 NOTE — Progress Notes (Signed)
BP 125/80 (BP Location: Left Arm, Cuff Size: Large)   Pulse 81   Temp 98.7 F (37.1 C) (Oral)   Wt 229 lb (103.9 kg)   LMP 07/27/2022 (Approximate)   SpO2 96%   BMI 41.75 kg/m    Subjective:    Patient ID: Kari Brandt, female    DOB: [birth year], 52 y.o.   MRN: [id number]  HPI: Kari Brandt is a 52 y.o. female  Chief Complaint  Patient presents with   Hypertension    Pt states she was at work this morning when her BP was checked and states it was elevated in the 170's.    HYPERTENSION without Chronic Kidney Disease Patient states she had a stomach bug over the weekend.  Didn't go to work yesterday due to diarrhea.  Went today and it was hot and she didn't eat or drink before going in.  Blood pressure was 170s.   Hypertension status: controlled  Satisfied with current treatment? yes Duration of hypertension: years BP monitoring frequency:  not checking BP range:  BP medication side effects:  no Medication compliance: excellent compliance Previous BP meds:HCTZ and lisinopril Aspirin: no Recurrent headaches: no Visual changes: no Palpitations: no Dyspnea: no Chest pain: no Lower extremity edema: no Dizzy/lightheaded: no  NECK PAIN Symptoms have been ongoing x 1 week.  Improving some but completely improved.   Status: better Treatments attempted: ibuprofen  Compliant with recommended treatment: yes Relief with NSAIDs?:  mild Location:Left Duration:weeks Severity: 5/10 Quality: sharp Frequency: intermittent Radiation: none Aggravating factors:  turning her head Alleviating factors: nothing Weakness:  no Paresthesias / decreased sensation:  no  Fevers:  no   Relevant past medical, surgical, family and social history reviewed and updated as indicated. Interim medical history since our last visit reviewed. Allergies and medications reviewed and updated.  Review of Systems  Eyes:  Negative for visual disturbance.  Respiratory:  Negative for  cough, chest tightness and shortness of breath.   Cardiovascular:  Negative for chest pain, palpitations and leg swelling.  Musculoskeletal:  Positive for neck pain.  Neurological:  Negative for dizziness and headaches.    Per HPI unless specifically indicated above     Objective:    BP 125/80 (BP Location: Left Arm, Cuff Size: Large)   Pulse 81   Temp 98.7 F (37.1 C) (Oral)   Wt 229 lb (103.9 kg)   LMP 07/27/2022 (Approximate)   SpO2 96%   BMI 41.75 kg/m   Wt Readings from Last 3 Encounters:  09/15/22 229 lb (103.9 kg)  07/06/22 235 lb 3.2 oz (106.7 kg)  06/04/22 235 lb 4.8 oz (106.7 kg)    Physical Exam Vitals and nursing note reviewed.  Constitutional:      General: She is not in acute distress.    Appearance: Normal appearance. She is normal weight. She is not ill-appearing, toxic-appearing or diaphoretic.  HENT:     Head: Normocephalic.     Right Ear: External ear normal.     Left Ear: External ear normal.     Nose: Nose normal.     Mouth/Throat:     Mouth: Mucous membranes are moist.     Pharynx: Oropharynx is clear.  Eyes:     General:        Right eye: No discharge.        Left eye: No discharge.     Extraocular Movements: Extraocular movements intact.     Conjunctiva/sclera: Conjunctivae normal.     Pupils:  Pupils are equal, round, and reactive to light.  Cardiovascular:     Rate and Rhythm: Normal rate and regular rhythm.     Heart sounds: No murmur heard. Pulmonary:     Effort: Pulmonary effort is normal. No respiratory distress.     Breath sounds: Normal breath sounds. No wheezing or rales.  Musculoskeletal:     Cervical back: Normal range of motion and neck supple.  Skin:    General: Skin is warm and dry.     Capillary Refill: Capillary refill takes less than 2 seconds.  Neurological:     General: No focal deficit present.     Mental Status: She is alert and oriented to person, place, and time. Mental status is at baseline.  Psychiatric:         Mood and Affect: Mood normal.        Behavior: Behavior normal.        Thought Content: Thought content normal.        Judgment: Judgment normal.     Results for orders placed or performed in visit on 07/06/22  HgB A1c  Result Value Ref Range   Hgb A1c MFr Bld 6.5 (H) 4.8 - 5.6 %   Est. average glucose Bld gHb Est-mCnc 140 mg/dL      Assessment & Plan:   Problem List Items Addressed This Visit       Cardiovascular and Mediastinum   Hypertension - Primary    Chronic. Controlled in office today.  Continue with Lisinopril 40mg  and hydrochlorothiazide 25mg .  Recommend checking blood pressure daily at home and keeping a log.  Bring log to next visit.  If blood pressures are elevated at that time can consider adding amlodipine.  Follow up in 2 weeks.  Call sooner if concerns arise.       Other Visit Diagnoses     Neck pain       Wil treat with Cyclobenzaprine at bedtime. Recommend ibuprofen PRN for pain.  Follow up in 2 weeks if not improved.        Follow up plan: Return in about 2 weeks (around 09/29/2022) for BP Check.

## 2022-09-15 NOTE — Telephone Encounter (Signed)
      Chief Complaint: BP 175/104 at work, got hot while working. Headache, dizzy. Had "stomach bug over the weekend." Symptoms: Above Frequency: Today Pertinent Negatives: Patient denies chest pain Disposition: [] ED /[] Urgent Care (no appt availability in office) / [x] Appointment(In office/virtual)/ []  [facility]/ [] Home Care/ [] Refused Recommended Disposition /[] [facility]/ []  Follow-up with PCP Additional Notes: Go to ED for worsening of symptoms.  Reason for Disposition  [1] Systolic BP  >= 200 OR Diastolic >= 120 AND [2] having NO cardiac or neurologic symptoms  Answer Assessment - Initial Assessment Questions 1. BLOOD PRESSURE: "What is the blood pressure?" "Did you take at least two measurements 5 minutes apart?"     175/104 2. ONSET: "When did you take your blood pressure?"     This morning 3. HOW: "How did you take your blood pressure?" (e.g., automatic home BP monitor, visiting nurse)     Work 4. HISTORY: "Do you have a history of high blood pressure?"     Yes 5. MEDICINES: "Are you taking any medicines for blood pressure?" "Have you missed any doses recently?"     Yes 6. OTHER SYMPTOMS: "Do you have any symptoms?" (e.g., blurred vision, chest pain, difficulty breathing, headache, weakness)     Headache, dizzy 7. PREGNANCY: "Is there any chance you are pregnant?" "When was your last menstrual period?"     No  Protocols used: Blood Pressure - High-A-AH

## 2022-09-15 NOTE — Assessment & Plan Note (Signed)
Chronic. Controlled in office today.  Continue with Lisinopril 40mg  and hydrochlorothiazide 25mg .  Recommend checking blood pressure daily at home and keeping a log.  Bring log to next visit.  If blood pressures are elevated at that time can consider adding amlodipine.  Follow up in 2 weeks.  Call sooner if concerns arise.

## 2022-09-18 ENCOUNTER — Encounter: Payer: Self-pay | Admitting: Nurse Practitioner

## 2022-09-19 ENCOUNTER — Other Ambulatory Visit: Payer: Self-pay | Admitting: Nurse Practitioner

## 2022-09-21 NOTE — Telephone Encounter (Signed)
Requested Prescriptions  Pending Prescriptions Disp Refills   hydrochlorothiazide (HYDRODIURIL) 25 MG tablet [Pharmacy Med Name: hydroCHLOROthiazide 25 MG Oral Tablet] 90 tablet 0    Sig: TAKE 1 TABLET BY MOUTH ONCE DAILY FOR HIGH BLOOD PRESSURE     Cardiovascular: Diuretics - Thiazide Passed - 09/19/2022 11:39 AM      Passed - Cr in normal range and within 180 days    Creatinine, Ser  Date Value Ref Range Status  06/04/2022 0.78 0.57 - 1.00 mg/dL Final         Passed - K in normal range and within 180 days    Potassium  Date Value Ref Range Status  06/04/2022 4.2 3.5 - 5.2 mmol/L Final         Passed - Na in normal range and within 180 days    Sodium  Date Value Ref Range Status  06/04/2022 142 134 - 144 mmol/L Final         Passed - Last BP in normal range    BP Readings from Last 1 Encounters:  09/15/22 125/80         Passed - Valid encounter within last 6 months    Recent Outpatient Visits           6 days ago Primary hypertension   [facility] [name], NP   2 months ago Morbid obesity [facility])   [facility] [name], NP   3 months ago Annual physical exam   [facility] [name], NP   9 months ago Primary hypertension   [facility] [name], NP   1 year ago Primary hypertension   [facility] [name], NP       Future Appointments             In 1 week Mecum, Oswaldo Conroy, PA-C  [facility]   In 2 weeks Mecum, Oswaldo Conroy, PA-C  [facility]

## 2022-09-29 ENCOUNTER — Ambulatory Visit: Payer: BC Managed Care – PPO | Admitting: Physician Assistant

## 2022-10-07 ENCOUNTER — Ambulatory Visit: Payer: BC Managed Care – PPO | Admitting: Physician Assistant

## 2022-10-09 ENCOUNTER — Ambulatory Visit: Payer: BC Managed Care – PPO | Admitting: Physician Assistant

## 2022-10-09 ENCOUNTER — Encounter: Payer: Self-pay | Admitting: Physician Assistant

## 2022-10-09 VITALS — BP 153/87 | HR 70 | Ht 62.0 in | Wt 232.2 lb

## 2022-10-09 DIAGNOSIS — M542 Cervicalgia: Secondary | ICD-10-CM

## 2022-10-09 DIAGNOSIS — I10 Essential (primary) hypertension: Secondary | ICD-10-CM

## 2022-10-09 MED ORDER — AMLODIPINE BESYLATE 2.5 MG PO TABS: 2.5000 mg | ORAL_TABLET | Freq: Every day | ORAL | 1 refills | Status: DC

## 2022-10-09 NOTE — Progress Notes (Signed)
Established Patient Office Visit  Name: Kari Brandt   MRN: [id number]    DOB: 1970-10-18   Date:10/09/2022  Today's Provider: Jacquelin Hawking, MHS, PA-C Introduced myself to the patient as a PA-C and provided education on APPs in clinical practice.         Subjective  Chief Complaint  Chief Complaint  Patient presents with   Hypertension    HPI   Hypertension: - Medications: Lisinopril 40 mg PO every day, hydrochlorothiazide 10 mg PO QD - Compliance:  - Checking BP at home: has not been checking at home  - Denies any SOB, CP, vision changes, LE edema, medication SEs, or symptoms of hypotension She is not engaged in regular exercise She is monitoring her sodium intake  but denies following a heart healthy diet   She reports neck pain since the beginning of June  States it has improved but will still flare a bit  Location: left side of neck Denies changes to ROM or inciting movements today  Aggravating: unsure  Alleviating: nothing to her knowledge  Interventions: Ibuprofen once per day    Patient Active Problem List   Diagnosis Date Noted   Mixed hyperlipidemia 12/04/2020   Elevated troponin    Chest pain 07/09/2020   Morbid obesity (HCC) 02/04/2020   Vitamin D deficiency 02/04/2020   Migraine 04/20/2019   Hypertension    Anxiety    Insomnia    GERD (gastroesophageal reflux disease)     Past Surgical History:  Procedure Laterality Date   ENDOMETRIAL ABLATION  2007   her option   TUBAL LIGATION     WRIST SURGERY      Family History  Problem Relation Age of Onset   Diabetes Mother    Heart disease Mother    Kidney disease Mother    Anxiety disorder Sister    Heart disease Maternal Grandmother    Breast cancer Neg Hx     Social History   Tobacco Use   Smoking status: Never   Smokeless tobacco: Never  Substance Use Topics   Alcohol use: Yes    Comment: occassionally     Current Outpatient Medications:    amLODipine (NORVASC)  2.5 MG tablet, Take 1 tablet (2.5 mg total) by mouth daily., Disp: 30 tablet, Rfl: 1   Ascorbic Acid (VITAMIN C) 1000 MG tablet, Take 1,000 mg by mouth daily., Disp: , Rfl:    ASPIRIN 81 PO, Take 1 tablet by mouth daily., Disp: , Rfl:    busPIRone (BUSPAR) 10 MG tablet, TAKE 1 TABLET BY MOUTH TWICE DAILY AS NEEDED FOR ANXIETY/PANIC, Disp: 180 tablet, Rfl: 3   hydrochlorothiazide (HYDRODIURIL) 25 MG tablet, TAKE 1 TABLET BY MOUTH ONCE DAILY FOR HIGH BLOOD PRESSURE, Disp: 90 tablet, Rfl: 0   lisinopril (ZESTRIL) 40 MG tablet, Take 1 tablet (40 mg total) by mouth daily., Disp: 90 tablet, Rfl: 1   loratadine (CLARITIN) 10 MG tablet, Take 10 mg by mouth daily., Disp: , Rfl:    VITAMIN D PO, Take 500 mg by mouth daily., Disp: , Rfl:    cyclobenzaprine (FLEXERIL) 10 MG tablet, Take 1 tablet (10 mg total) by mouth at bedtime., Disp: 30 tablet, Rfl: 0  No Known Allergies  I personally reviewed active problem list, medication list, allergies, notes from last encounter, lab results with the patient/caregiver today.   Review of Systems  Eyes:  Negative for blurred vision and double vision.  Respiratory:  Negative for  shortness of breath and wheezing.   Cardiovascular:  Negative for chest pain, palpitations and leg swelling.  Musculoskeletal:  Positive for neck pain.  Neurological:  Positive for headaches (one on Monday but it has resolved). Negative for dizziness.      Objective  Vitals:   10/09/22 0949 10/09/22 0953  BP: (!) 147/83 (!) 153/87  Pulse: 67 70  SpO2: 97%   Weight: 232 lb 3.2 oz (105.3 kg)   Height: 5\' 2"  (1.575 m)     Body mass index is 42.47 kg/m.  Physical Exam Vitals reviewed.  Constitutional:      General: She is awake.     Appearance: Normal appearance. She is well-developed and well-groomed.  HENT:     Head: Normocephalic and atraumatic.  Cardiovascular:     Rate and Rhythm: Normal rate and regular rhythm.     Pulses: Normal pulses.          Radial pulses are  2+ on the right side and 2+ on the left side.     Heart sounds: Normal heart sounds. No murmur heard.    No friction rub. No gallop.  Pulmonary:     Effort: Pulmonary effort is normal.     Breath sounds: Normal breath sounds.  Musculoskeletal:     Cervical back: Normal range of motion. No edema, erythema or torticollis. No pain with movement or muscular tenderness. Normal range of motion.     Right lower leg: No edema.     Left lower leg: No edema.  Neurological:     Mental Status: She is alert.  Psychiatric:        Behavior: Behavior is cooperative.      No results found for this or any previous visit (from the past 2160 hour(s)).   PHQ2/9:    10/09/2022    9:55 AM 07/06/2022   10:06 AM 06/04/2022    8:15 AM 12/04/2021    1:51 PM 09/04/2021   10:30 AM  Depression screen PHQ 2/9  Decreased Interest 0 0 0 0 0  Down, Depressed, Hopeless 0 0 1 0 0  PHQ - 2 Score 0 0 1 0 0  Altered sleeping 0 1 1 0 1  Tired, decreased energy 1 1 1 1  0  Change in appetite 0 0 0 0 0  Feeling bad or failure about yourself  0 0 0 0 0  Trouble concentrating 0 0 0 0 0  Moving slowly or fidgety/restless 0 0 0 0 0  Suicidal thoughts 0 0 0 0 0  PHQ-9 Score 1 2 3 1 1   Difficult doing work/chores Not difficult at all Not difficult at all Not difficult at all Not difficult at all Not difficult at all      Fall Risk:    10/09/2022    9:56 AM 07/06/2022   10:05 AM 06/04/2022    8:14 AM 12/04/2021    1:51 PM 09/04/2021   10:30 AM  Fall Risk   Falls in the past year? 0 0 0 0 0  Number falls in past yr: 0 0 0 0 0  Injury with Fall? 0 0 0 0 0  Risk for fall due to : No Fall Risks No Fall Risks No Fall Risks No Fall Risks No Fall Risks  Follow up Falls evaluation completed Falls evaluation completed Falls evaluation completed Falls evaluation completed Falls evaluation completed      Functional Status Survey:      Assessment & Plan  Problem List  Items Addressed This Visit       Cardiovascular  and Mediastinum   Hypertension - Primary    Chronic, historic condition  Does not appear controlled today in office and she does not have home logs for review She is taking Lisinopril 40 mg PO every day and hydrochlorothiazide 25 mg PO every day Continue regimen and will add Amlodipine 2.5 mg PO every day for additional control  Recommend checking BP daily at home so we can review how BP is outside of office Follow up in 2 weeks to assess response to these changes or sooner if concerns arise      Relevant Medications   amLODipine (NORVASC) 2.5 MG tablet   Other Visit Diagnoses     Neck pain     Acute, improving Patient reports intermittent left sided neck pain since her previous visit but reports increasing intervals without pain Recommend alternating Tylenol and Ibuprofen for pain control Will provide neck exercises to prevent stiffness, can also use warm compresses and massage to further assist with resolution Will follow up in 2 weeks or sooner if concerns arise         Return in about 2 weeks (around 10/23/2022) for HTN- BP recheck .   I, Jullia Mulligan E Beata Beason, PA-C, have reviewed all documentation for this visit. The documentation on 10/09/22 for the exam, diagnosis, procedures, and orders are all accurate and complete.   Jacquelin Hawking, MHS, PA-C [facility]

## 2022-10-09 NOTE — Patient Instructions (Signed)
   Rest Warm compresses to the area (20 minutes on, minimum of 30 minutes off) You can alternate Tylenol and Ibuprofen for pain management but Ibuprofen is typically preferred to reduce inflammation.  Gentle stretches and exercises that I have included in your paperwork Try to reduce excess strain to the area and rest as much as possible    If these measures do not lead to improvement in your symptoms over the next 2-4 weeks please let us know

## 2022-10-09 NOTE — Assessment & Plan Note (Signed)
Chronic, historic condition  Does not appear controlled today in office and she does not have home logs for review She is taking Lisinopril 40 mg PO every day and hydrochlorothiazide 25 mg PO every day Continue regimen and will add Amlodipine 2.5 mg PO every day for additional control  Recommend checking BP daily at home so we can review how BP is outside of office Follow up in 2 weeks to assess response to these changes or sooner if concerns arise

## 2022-10-26 ENCOUNTER — Encounter: Payer: Self-pay | Admitting: Physician Assistant

## 2022-10-26 ENCOUNTER — Ambulatory Visit: Payer: BC Managed Care – PPO | Admitting: Physician Assistant

## 2022-10-26 VITALS — BP 139/80 | HR 75 | Ht 62.0 in | Wt 232.6 lb

## 2022-10-26 DIAGNOSIS — I10 Essential (primary) hypertension: Secondary | ICD-10-CM | POA: Diagnosis not present

## 2022-10-26 DIAGNOSIS — R7309 Other abnormal glucose: Secondary | ICD-10-CM | POA: Diagnosis not present

## 2022-10-26 LAB — BAYER DCA HB A1C WAIVED: HB A1C (BAYER DCA - WAIVED): 6.5 % — ABNORMAL HIGH (ref 4.8–5.6)

## 2022-10-26 NOTE — Assessment & Plan Note (Signed)
Chronic, historic condition  Appears to be better controlled today compared to previous apts She is taking Lisinopril 40 mg PO every day, hydrochlorothiazide 25 mg po every day, Amlodipine 2.5 mg PO every day and appears to be tolerating well Continue current regimen Follow up in about 6 weeks to assess continued response and re-establish with normal 6 month intervals

## 2022-10-26 NOTE — Progress Notes (Signed)
Acute Office Visit   Patient: Kari Brandt   DOB: July 09, 1970   52 y.o. Female  MRN: [id number] Visit Date: 10/26/2022  Today's healthcare provider: Oswaldo Conroy Lucely Leard, PA-C  Introduced myself to the patient as a Secondary school teacher and provided education on APPs in clinical practice.    Chief Complaint  Patient presents with   Hypertension   Subjective    HPI    Hypertension: - Medications: Lisinopril 40 mg PO every day, hydrochlorothiazide 25 mg po every day, Amlodipine 2.5 mg PO QD - Compliance: good compliance  - Checking BP at home: is not checking at home  - Denies any SOB, CP, vision changes, LE edema, medication SEs, or symptoms of hypotension Kari Brandt denies side effects so far from starting Amlodipine   Kari Brandt reports that PCP mentioned rechecking A1c as previous one was elevated    Medications: Outpatient Medications Prior to Visit  Medication Sig   amLODipine (NORVASC) 2.5 MG tablet Take 1 tablet (2.5 mg total) by mouth daily.   Ascorbic Acid (VITAMIN C) 1000 MG tablet Take 1,000 mg by mouth daily.   ASPIRIN 81 PO Take 1 tablet by mouth daily.   busPIRone (BUSPAR) 10 MG tablet TAKE 1 TABLET BY MOUTH TWICE DAILY AS NEEDED FOR ANXIETY/PANIC   hydrochlorothiazide (HYDRODIURIL) 25 MG tablet TAKE 1 TABLET BY MOUTH ONCE DAILY FOR HIGH BLOOD PRESSURE   lisinopril (ZESTRIL) 40 MG tablet Take 1 tablet (40 mg total) by mouth daily.   loratadine (CLARITIN) 10 MG tablet Take 10 mg by mouth daily.   VITAMIN D PO Take 500 mg by mouth daily.   [DISCONTINUED] cyclobenzaprine (FLEXERIL) 10 MG tablet Take 1 tablet (10 mg total) by mouth at bedtime.   No facility-administered medications prior to visit.    Review of Systems  Respiratory:  Negative for shortness of breath.   Cardiovascular:  Negative for chest pain, palpitations and leg swelling.         Objective    BP 139/80   Pulse 75   Ht 5\' 2"  (1.575 m)   Wt 232 lb 9.6 oz (105.5 kg)   SpO2 97%   BMI 42.54 kg/m       Physical Exam Vitals reviewed.  Constitutional:      General: Kari Brandt is awake.     Appearance: Normal appearance. Kari Brandt is well-developed and well-groomed.  HENT:     Head: Normocephalic and atraumatic.  Cardiovascular:     Rate and Rhythm: Normal rate and regular rhythm.     Pulses: Normal pulses.          Radial pulses are 2+ on the right side and 2+ on the left side.     Heart sounds: Normal heart sounds. No murmur heard.    No friction rub. No gallop.  Pulmonary:     Effort: Pulmonary effort is normal.     Breath sounds: Normal breath sounds. No decreased air movement. No decreased breath sounds, wheezing, rhonchi or rales.  Musculoskeletal:     Right lower leg: No edema.     Left lower leg: No edema.  Neurological:     Mental Status: Kari Brandt is alert.  Psychiatric:        Behavior: Behavior is cooperative.       No results found for any visits on 10/26/22.  Assessment & Plan      Return in about 6 weeks (around 12/07/2022) for HTN, labs follow up .  Problem List Items Addressed This Visit       Cardiovascular and Mediastinum   Hypertension - Primary    Chronic, historic condition  Appears to be better controlled today compared to previous apts Kari Brandt is taking Lisinopril 40 mg PO every day, hydrochlorothiazide 25 mg po every day, Amlodipine 2.5 mg PO every day and appears to be tolerating well Continue current regimen Follow up in about 6 weeks to assess continued response and re-establish with normal 6 month intervals       Other Visit Diagnoses     Elevated hemoglobin A1c     Previous A1c was elevated at 6.5  Recheck today to confirm DM dx  Results to dictate further management     Relevant Orders   Bayer DCA Hb A1c Waived (STAT)        Return in about 6 weeks (around 12/07/2022) for HTN, labs follow up .   I, Theoplis Garciagarcia E Ashwini Jago, PA-C, have reviewed all documentation for this visit. The documentation on 10/26/22 for the exam, diagnosis, procedures,  and orders are all accurate and complete.   Jacquelin Hawking, MHS, PA-C [facility]

## 2022-10-28 NOTE — Progress Notes (Signed)
Your A1c was 6.5 which places you in the diabetic range. At this time medications are not indicated but I do recommend diet and exercise to help prevent progression.  This includes limiting your sugar and carbohydrate intake and exercising several times per week (I typically recommend 150 minutes of moderate intensity physical activity per week)  If you would like, we can place a referral for you to have diabetes education and nutrition counseling to help you on this journey. If you'd like to do some research at home, the American Diabetes Association website has great information to help patients. Please let us know if you have further questions or concerns

## 2022-11-04 ENCOUNTER — Telehealth: Payer: Self-pay

## 2022-11-04 NOTE — Telephone Encounter (Signed)
Called and scheduled patient's mammogram for 11/19/22 at 8:20 AM.   Called and LVM notifying patient of appointment date and time. Also advised patient that appointment is available on her mychart account as well.

## 2022-11-04 NOTE — Telephone Encounter (Signed)
-----   Message from Olevia Perches sent at 10/27/2022  1:56 PM EDT ----- Please schedule mammogram

## 2022-11-05 IMAGING — DX DG FOOT COMPLETE 3+V*L*
3 series · 3 of 3 positions shown · non-contrast
Comparison: None.

CLINICAL DATA: LEFT heel pain.

EXAM:
LEFT FOOT - COMPLETE 3+ VIEW

[foot ap]
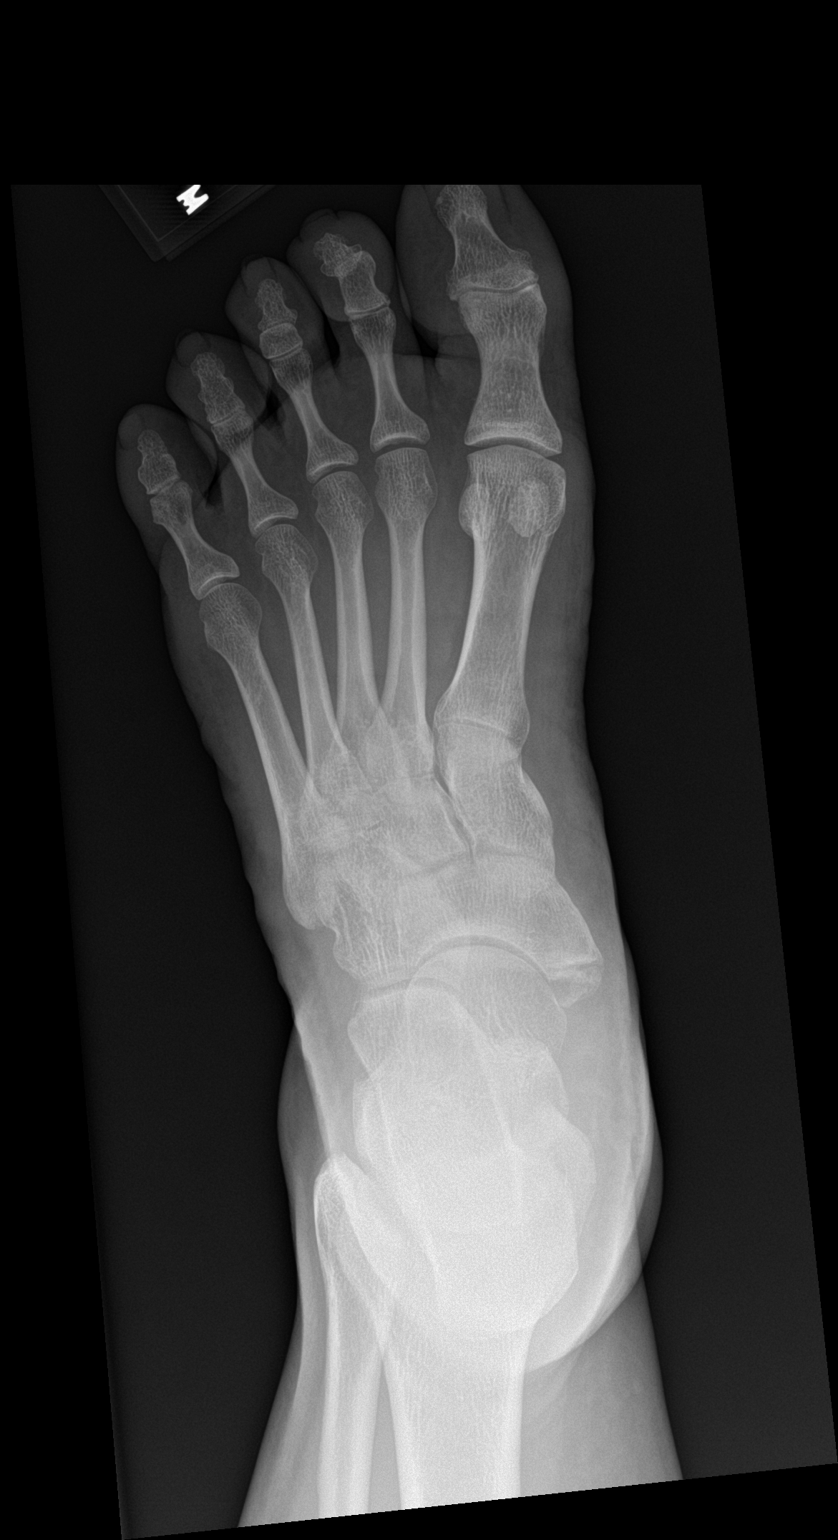

[foot obl]
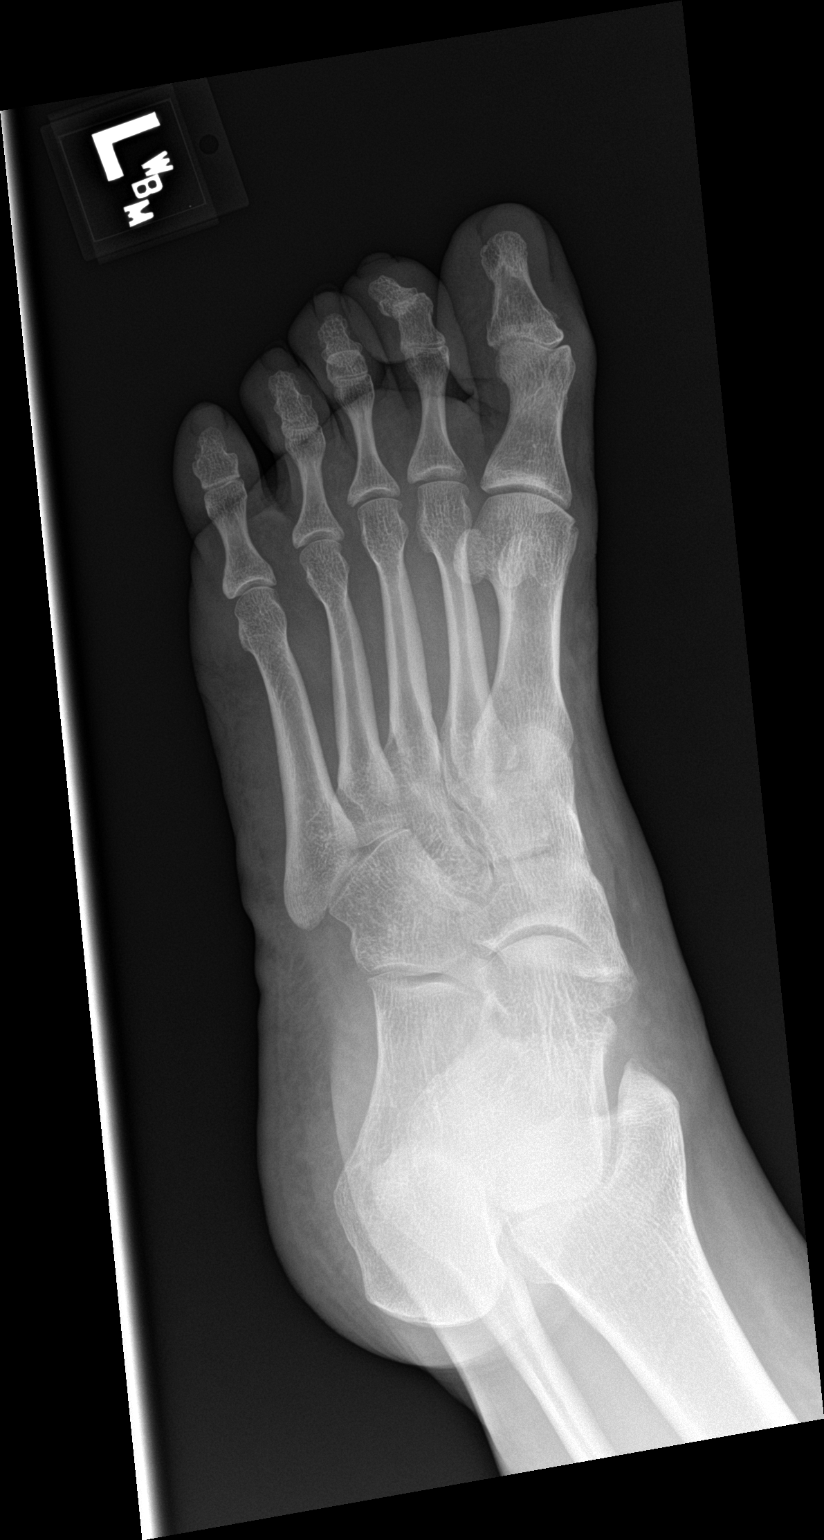

[foot lat]
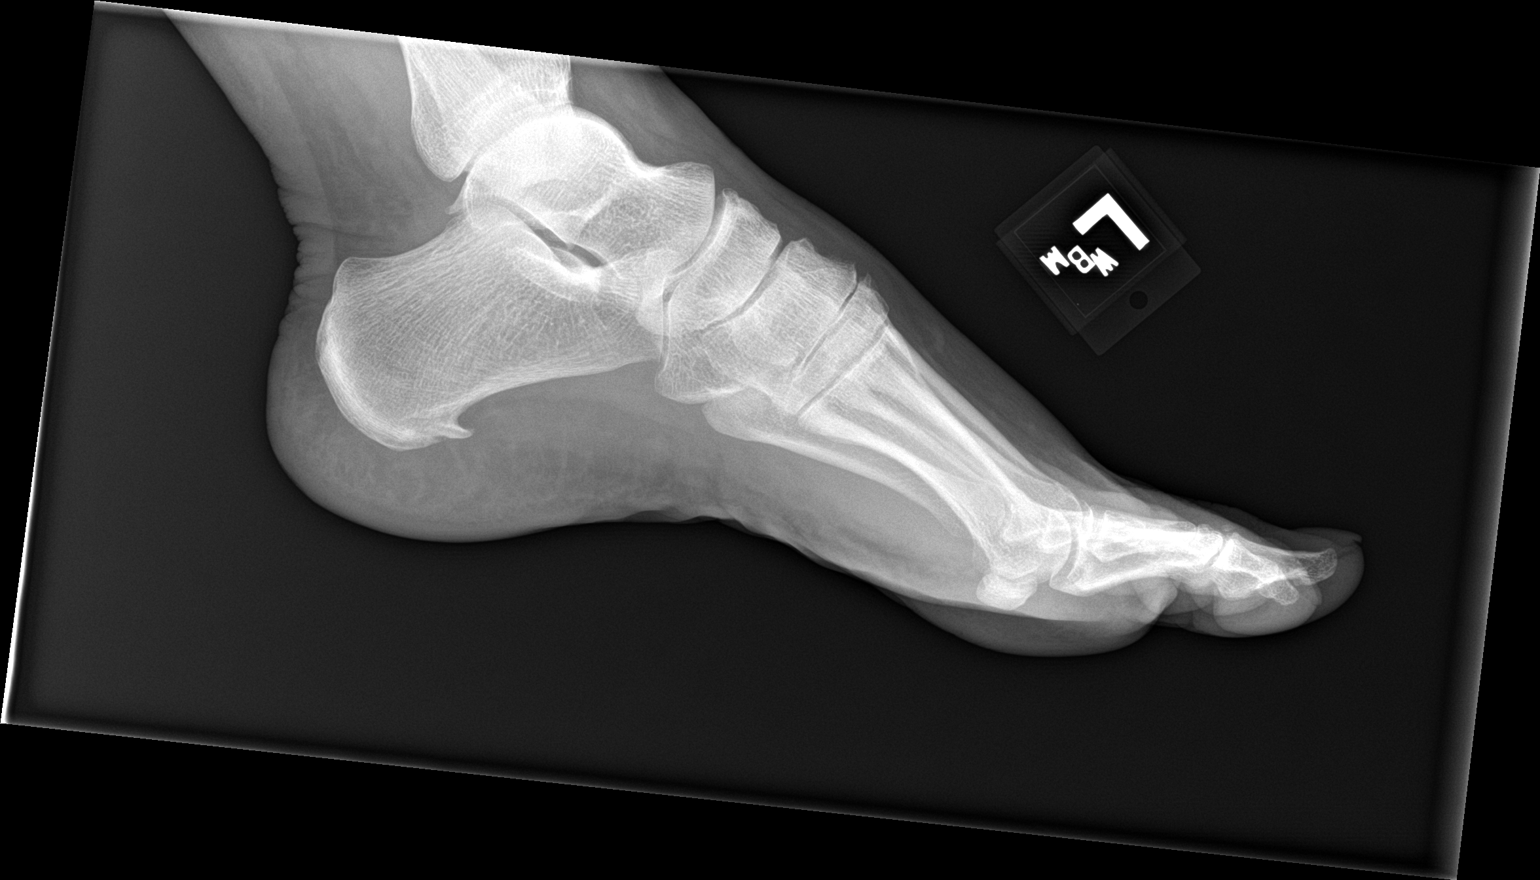

[3 of 3 positions shown; findings below may reference images not displayed]

FINDINGS: There is no evidence of fracture or dislocation. Small plantar spur
is present. Incidental note of accessory ossification of the
navicular.
IMPRESSION: No evidence for acute  abnormality.

## 2022-11-19 ENCOUNTER — Ambulatory Visit
Admission: RE | Admit: 2022-11-19 | Discharge: 2022-11-19 | Disposition: A | Discharge status: Home / Self Care | Payer: BC Managed Care – PPO | Source: Ambulatory Visit | Attending: Nurse Practitioner | Admitting: Nurse Practitioner

## 2022-11-19 DIAGNOSIS — Z1231 Encounter for screening mammogram for malignant neoplasm of breast: Secondary | ICD-10-CM | POA: Insufficient documentation

## 2022-11-20 NOTE — Progress Notes (Signed)
Mammogram did not find evidence of malignancy Recommended repeat screening mammogram in one year.

## 2022-12-18 ENCOUNTER — Other Ambulatory Visit: Payer: Self-pay | Admitting: Nurse Practitioner

## 2022-12-18 NOTE — Telephone Encounter (Signed)
Requested Prescriptions  Pending Prescriptions Disp Refills   hydrochlorothiazide (HYDRODIURIL) 25 MG tablet [Pharmacy Med Name: hydroCHLOROthiazide 25 MG Oral Tablet] 90 tablet 1    Sig: TAKE 1 TABLET BY MOUTH ONCE DAILY FOR HIGH BLOOD PRESSURE     Cardiovascular: Diuretics - Thiazide Failed - 12/18/2022  9:11 AM      Failed - Cr in normal range and within 180 days    Creatinine, Ser  Date Value Ref Range Status  06/04/2022 0.78 0.57 - 1.00 mg/dL Final         Failed - K in normal range and within 180 days    Potassium  Date Value Ref Range Status  06/04/2022 4.2 3.5 - 5.2 mmol/L Final         Failed - Na in normal range and within 180 days    Sodium  Date Value Ref Range Status  06/04/2022 142 134 - 144 mmol/L Final         Passed - Last BP in normal range    BP Readings from Last 1 Encounters:  10/26/22 139/80         Passed - Valid encounter within last 6 months    Recent Outpatient Visits           1 month ago Primary hypertension   [place] [facility] Mecum, Oswaldo Conroy, PA-C   2 months ago Primary hypertension   [place] [facility] Mecum, Oswaldo Conroy, PA-C   3 months ago Primary hypertension   [place] [facility] [name], NP   5 months ago Morbid obesity [facility])   [place] [facility] [name], NP   6 months ago Annual physical exam   [place] [facility] [name], NP       Future Appointments             In 1 week [name], NP [facility]

## 2022-12-19 ENCOUNTER — Other Ambulatory Visit: Payer: Self-pay | Admitting: Physician Assistant

## 2022-12-19 DIAGNOSIS — I10 Essential (primary) hypertension: Secondary | ICD-10-CM

## 2022-12-21 NOTE — Telephone Encounter (Signed)
Requested Prescriptions  Pending Prescriptions Disp Refills   amLODipine (NORVASC) 2.5 MG tablet [Pharmacy Med Name: amLODIPine Besylate 2.5 MG Oral Tablet] 30 tablet 0    Sig: Take 1 tablet by mouth once daily     Cardiovascular: Calcium Channel Blockers 2 Passed - 12/19/2022 10:59 AM      Passed - Last BP in normal range    BP Readings from Last 1 Encounters:  10/26/22 139/80         Passed - Last Heart Rate in normal range    Pulse Readings from Last 1 Encounters:  10/26/22 75         Passed - Valid encounter within last 6 months    Recent Outpatient Visits           1 month ago Primary hypertension   [place] [facility] Mecum, Oswaldo Conroy, PA-C   2 months ago Primary hypertension   [place] [facility] Mecum, Oswaldo Conroy, PA-C   3 months ago Primary hypertension   [place] [facility] [name], NP   5 months ago Morbid obesity [facility])   [place] [facility] [name], NP   6 months ago Annual physical exam   [place] [facility] [name], NP       Future Appointments             In 1 week [name], NP [place] [facility], [facility]

## 2022-12-30 DIAGNOSIS — E118 Type 2 diabetes mellitus with unspecified complications: Secondary | ICD-10-CM | POA: Insufficient documentation

## 2022-12-30 NOTE — Progress Notes (Unsigned)
There were no vitals taken for this visit.   Subjective:    Patient ID: Kari Brandt, female    DOB: [birth year], 52 y.o.   MRN: [id number]  HPI: Kari Brandt is a 52 y.o. female  No chief complaint on file.  HYPERTENSION without Chronic Kidney Disease Hypertension status: controlled  Satisfied with current treatment? yes Duration of hypertension: years BP monitoring frequency:  daily BP range: 120-130/80 BP medication side effects:  no Medication compliance: excellent compliance Previous BP meds:HCTZ and lisinopril Aspirin: no Recurrent headaches: no Visual changes: no Palpitations: no Dyspnea: no Chest pain: no Lower extremity edema: no Dizzy/lightheaded: no  DIABETES Hypoglycemic episodes:{Blank single:19197::"yes","no"} Polydipsia/polyuria: {Blank single:19197::"yes","no"} Visual disturbance: {Blank single:19197::"yes","no"} Chest pain: {Blank single:19197::"yes","no"} Paresthesias: {Blank single:19197::"yes","no"} Glucose Monitoring: {Blank single:19197::"yes","no"}  Accucheck frequency: {Blank single:19197::"Not Checking","Daily","BID","TID"}  Fasting glucose:  Post prandial:  Evening:  Before meals: Taking Insulin?: {Blank single:19197::"yes","no"}  Long acting insulin:  Short acting insulin: Blood Pressure Monitoring: {Blank single:19197::"not checking","rarely","daily","weekly","monthly","a few times a day","a few times a week","a few times a month"} Retinal Examination: {Blank single:19197::"Up to Date","Not up to Date"} Foot Exam: {Blank single:19197::"Up to Date","Not up to Date"} Diabetic Education: {Blank single:19197::"Completed","Not Completed"} Pneumovax: {Blank single:19197::"Up to Date","Not up to Date","unknown"} Influenza: {Blank single:19197::"Up to Date","Not up to Date","unknown"} Aspirin: {Blank single:19197::"yes","no"}  ANXIETY  Relevant past medical, surgical, family and social history reviewed and updated as  indicated. Interim medical history since our last visit reviewed. Allergies and medications reviewed and updated.  Review of Systems  Eyes:  Negative for visual disturbance.  Respiratory:  Negative for cough, chest tightness and shortness of breath.   Cardiovascular:  Negative for chest pain, palpitations and leg swelling.  Neurological:  Negative for dizziness and headaches.    Per HPI unless specifically indicated above     Objective:    There were no vitals taken for this visit.  Wt Readings from Last 3 Encounters:  10/26/22 232 lb 9.6 oz (105.5 kg)  10/09/22 232 lb 3.2 oz (105.3 kg)  09/15/22 229 lb (103.9 kg)    Physical Exam Vitals and nursing note reviewed.  Constitutional:      General: She is not in acute distress.    Appearance: Normal appearance. She is not ill-appearing, toxic-appearing or diaphoretic.  HENT:     Head: Normocephalic.     Right Ear: External ear normal.     Left Ear: External ear normal.     Nose: Nose normal.     Mouth/Throat:     Mouth: Mucous membranes are moist.     Pharynx: Oropharynx is clear.  Eyes:     General:        Right eye: No discharge.        Left eye: No discharge.     Extraocular Movements: Extraocular movements intact.     Conjunctiva/sclera: Conjunctivae normal.     Pupils: Pupils are equal, round, and reactive to light.  Cardiovascular:     Rate and Rhythm: Normal rate and regular rhythm.     Heart sounds: No murmur heard. Pulmonary:     Effort: Pulmonary effort is normal. No respiratory distress.     Breath sounds: Normal breath sounds. No wheezing or rales.  Musculoskeletal:     Cervical back: Normal range of motion and neck supple.  Skin:    General: Skin is warm and dry.     Capillary Refill: Capillary refill takes less than 2 seconds.  Neurological:     General: No focal deficit present.  Mental Status: She is alert and oriented to person, place, and time. Mental status is at baseline.  Psychiatric:         Mood and Affect: Mood normal.        Behavior: Behavior normal.        Thought Content: Thought content normal.        Judgment: Judgment normal.     Results for orders placed or performed in visit on 10/26/22  Bayer DCA Hb A1c Waived (STAT)  Result Value Ref Range   HB A1C (BAYER DCA - WAIVED) 6.5 (H) 4.8 - 5.6 %      Assessment & Plan:   Problem List Items Addressed This Visit       Cardiovascular and Mediastinum   Hypertension - Primary     Endocrine   Controlled diabetes mellitus type 2 with complications (HCC)     Other   Anxiety   Morbid obesity (HCC)   Vitamin D deficiency   Mixed hyperlipidemia     Follow up plan: No follow-ups on file.

## 2022-12-31 ENCOUNTER — Ambulatory Visit: Payer: BC Managed Care – PPO | Admitting: Nurse Practitioner

## 2022-12-31 ENCOUNTER — Encounter: Payer: Self-pay | Admitting: Nurse Practitioner

## 2022-12-31 DIAGNOSIS — E118 Type 2 diabetes mellitus with unspecified complications: Secondary | ICD-10-CM | POA: Diagnosis not present

## 2022-12-31 DIAGNOSIS — E559 Vitamin D deficiency, unspecified: Secondary | ICD-10-CM | POA: Diagnosis not present

## 2022-12-31 DIAGNOSIS — F419 Anxiety disorder, unspecified: Secondary | ICD-10-CM

## 2022-12-31 DIAGNOSIS — R300 Dysuria: Secondary | ICD-10-CM

## 2022-12-31 DIAGNOSIS — E782 Mixed hyperlipidemia: Secondary | ICD-10-CM | POA: Diagnosis not present

## 2022-12-31 DIAGNOSIS — I10 Essential (primary) hypertension: Secondary | ICD-10-CM

## 2022-12-31 DIAGNOSIS — Z23 Encounter for immunization: Secondary | ICD-10-CM | POA: Diagnosis not present

## 2022-12-31 LAB — URINALYSIS, ROUTINE W REFLEX MICROSCOPIC
Bilirubin, UA: NEGATIVE
Glucose, UA: NEGATIVE
Ketones, UA: NEGATIVE
Leukocytes,UA: NEGATIVE
Nitrite, UA: NEGATIVE
Protein,UA: NEGATIVE
RBC, UA: NEGATIVE
Specific Gravity, UA: >1.03 — ABNORMAL HIGH (ref 1.005–1.030)
Urobilinogen, Ur: 0.2 mg/dL (ref 0.2–1.0)
pH, UA: 5.5 (ref 5.0–7.5)

## 2022-12-31 LAB — MICROALBUMIN, URINE WAIVED
Creatinine, Urine Waived: 300 mg/dL (ref 10–300)
Microalb, Ur Waived: 10 mg/L (ref 0–19)
Microalb/Creat Ratio: <30 mg/g (ref <30)

## 2022-12-31 MED ORDER — ROSUVASTATIN CALCIUM 5 MG PO TABS: 5.0000 mg | ORAL_TABLET | Freq: Every day | ORAL | 1 refills | Status: DC

## 2022-12-31 MED ORDER — LISINOPRIL 40 MG PO TABS: 40.0000 mg | ORAL_TABLET | Freq: Every day | ORAL | 1 refills | Status: DC

## 2022-12-31 MED ORDER — AMLODIPINE BESYLATE 2.5 MG PO TABS
2.5000 mg | ORAL_TABLET | Freq: Every day | ORAL | 1 refills | Status: DC
Start: 2022-12-31 — End: 2023-07-21

## 2022-12-31 MED ORDER — METFORMIN HCL 500 MG PO TABS: 500.0000 mg | ORAL_TABLET | Freq: Two times a day (BID) | ORAL | 1 refills | Status: DC

## 2022-12-31 NOTE — Assessment & Plan Note (Signed)
Labs ordered at visit today.  Will make recommendations based on lab results.   

## 2022-12-31 NOTE — Assessment & Plan Note (Signed)
Recommended eating smaller high protein, low fat meals more frequently and exercising 30 mins a day 5 times a week with a goal of 10-15lb weight loss in the next 3 months. Starting metformin due to new diagnosis of diabetes.  Can consider adding Ozempic.  Discussed with patient during visit today.

## 2022-12-31 NOTE — Assessment & Plan Note (Signed)
Chronic.  Will add Rosuvastatin for cardiac protection.  Side effects and benefits discussed during visit today.  Labs ordered.  Vaccines up to date.

## 2022-12-31 NOTE — Assessment & Plan Note (Signed)
New diagnosis.  Discussed ADA recommendations during visit.  Will add Metformin 500mg  BID.  Side effects and benefits discussed.  Will work to get patient scheduled for eye exam with [facility]. Foot exam, pneumonia shot, and microalbumin ordered today.  Will recheck A1c at visit today.  Follow up in 3 months.  Call sooner if concerns arise.

## 2022-12-31 NOTE — Assessment & Plan Note (Signed)
Chronic. Controlled in office today.  Continue with Lisinopril 40mg , Amlodipine 2.5mg  and hydrochlorothiazide 25mg .  Recommend checking blood pressure daily at home and keeping a log.  Bring log to next visit.  Follow up in 3 months.  Call sooner if concerns arise.

## 2022-12-31 NOTE — Assessment & Plan Note (Signed)
Chronic.  Controlled.  Continue with current medication regimen of PRN Buspar.  Labs ordered today.  Return to clinic in 6 months for reevaluation.  Call sooner if concerns arise.

## 2023-01-01 LAB — COMPREHENSIVE METABOLIC PANEL
ALT: 21 [IU]/L (ref 0–32)
AST: 19 [IU]/L (ref 0–40)
Albumin: 4.7 g/dL (ref 3.9–4.9)
Alkaline Phosphatase: 129 [IU]/L — ABNORMAL HIGH (ref 44–121)
BUN/Creatinine Ratio: 15 (ref 9–23)
BUN: 12 mg/dL (ref 6–24)
Bilirubin Total: 0.4 mg/dL (ref 0.0–1.2)
CO2: 25 mmol/L (ref 20–29)
Calcium: 9.6 mg/dL (ref 8.7–10.2)
Chloride: 103 mmol/L (ref 96–106)
Creatinine, Ser: 0.79 mg/dL (ref 0.57–1.00)
Globulin, Total: 2.2 g/dL (ref 1.5–4.5)
Glucose: 85 mg/dL (ref 70–99)
Potassium: 3.8 mmol/L (ref 3.5–5.2)
Sodium: 144 mmol/L (ref 134–144)
Total Protein: 6.9 g/dL (ref 6.0–8.5)
eGFR: 91 mL/min/{1.73_m2} (ref >59)

## 2023-01-01 LAB — VITAMIN D 25 HYDROXY (VIT D DEFICIENCY, FRACTURES): Vit D, 25-Hydroxy: 35.1 ng/mL (ref 30.0–100.0)

## 2023-01-01 LAB — LIPID PANEL
Chol/HDL Ratio: 5.3 {ratio} — ABNORMAL HIGH (ref 0.0–4.4)
Cholesterol, Total: 203 mg/dL — ABNORMAL HIGH (ref 100–199)
HDL: 38 mg/dL — ABNORMAL LOW (ref >39)
LDL Chol Calc (NIH): 136 mg/dL — ABNORMAL HIGH (ref 0–99)
Triglycerides: 158 mg/dL — ABNORMAL HIGH (ref 0–149)
VLDL Cholesterol Cal: 29 mg/dL (ref 5–40)

## 2023-01-01 LAB — HEMOGLOBIN A1C
Est. average glucose Bld gHb Est-mCnc: 140 mg/dL
Hgb A1c MFr Bld: 6.5 % — ABNORMAL HIGH (ref 4.8–5.6)

## 2023-01-01 NOTE — Progress Notes (Signed)
Hi Kari Brandt. It was nice to see you yesterday.  Your A1c is still well controlled at 6.5% which is great.  Your cholesterol is elevated but starting the new cholesterol will improve.  Your lab work looks good.  No concerns at this time. Continue with your current medication regimen.  Follow up as discussed.  Please let me know if you have any questions.

## 2023-03-15 ENCOUNTER — Ambulatory Visit: Payer: Self-pay

## 2023-03-15 NOTE — Telephone Encounter (Signed)
  Chief Complaint: cough Symptoms: cough productive with white thick mucus, rattling in chest at times Frequency: 2 weeks Pertinent Negatives: Patient denies SOB or fever Disposition: [] ED /[] Urgent Care (no appt availability in office) / [] Appointment(In office/virtual)/ [x]  [place] [facility]/ [] Home Care/ [] Refused Recommended Disposition /[] [facility]/ []  Follow-up with PCP Additional Notes: pt states she has been taking sinus relief and generic Dayquil but nothing helping cough. No appts and practice closed for holidays, offered VUC, pt accepted and scheduled appt for tomorrow at 0830. Care advice given and pt verbalized understanding.   Summary: Seeking Rx, coughing   Pt called requesting cough medicine, symptomatic for 2 week. OTC medicine does not seem to help. Seeking prescription.     Reason for Disposition  [1] Continuous (nonstop) coughing interferes with work or school AND [2] no improvement using cough treatment per Care Advice  Answer Assessment - Initial Assessment Questions 1. ONSET: "When did the cough begin?"      2 weeks 3. SPUTUM: "Describe the color of your sputum" (none, dry cough; clear, white, yellow, green)     White thick 5. DIFFICULTY BREATHING: "Are you having difficulty breathing?" If Yes, ask: "How bad is it?" (e.g., mild, moderate, severe)    - MILD: No SOB at rest, mild SOB with walking, speaks normally in sentences, can lie down, no retractions, pulse < 100.    - MODERATE: SOB at rest, SOB with minimal exertion and prefers to sit, cannot lie down flat, speaks in phrases, mild retractions, audible wheezing, pulse 100-120.    - SEVERE: Very SOB at rest, speaks in single words, struggling to breathe, sitting hunched forward, retractions, pulse > 120      no 6. FEVER: "Do you have a fever?" If Yes, ask: "What is your temperature, how was it measured, and when did it start?"     no 8. LUNG HISTORY: "Do you have any history of lung disease?"   (e.g., pulmonary embolus, asthma, emphysema)     no 10. OTHER SYMPTOMS: "Do you have any other symptoms?" (e.g., runny nose, wheezing, chest pain)       Rattling in chest  Protocols used: Cough - Acute Productive-A-AH

## 2023-03-16 ENCOUNTER — Telehealth: Payer: BC Managed Care – PPO

## 2023-03-16 DIAGNOSIS — J208 Acute bronchitis due to other specified organisms: Secondary | ICD-10-CM | POA: Diagnosis not present

## 2023-03-16 DIAGNOSIS — B9689 Other specified bacterial agents as the cause of diseases classified elsewhere: Secondary | ICD-10-CM | POA: Diagnosis not present

## 2023-03-16 MED ORDER — ALBUTEROL SULFATE HFA 108 (90 BASE) MCG/ACT IN AERS
1.0000 | INHALATION_SPRAY | Freq: Four times a day (QID) | RESPIRATORY_TRACT | 0 refills | Status: DC | PRN
Start: 2023-03-16 — End: 2023-10-04

## 2023-03-16 MED ORDER — PROMETHAZINE-DM 6.25-15 MG/5ML PO SYRP
5.0000 mL | ORAL_SOLUTION | Freq: Four times a day (QID) | ORAL | 0 refills | Status: DC | PRN
Start: 2023-03-16 — End: 2023-04-02

## 2023-03-16 MED ORDER — AMOXICILLIN-POT CLAVULANATE 875-125 MG PO TABS: 1.0000 | ORAL_TABLET | Freq: Two times a day (BID) | ORAL | 0 refills | Status: DC

## 2023-03-16 MED ORDER — BENZONATATE 100 MG PO CAPS
100.0000 mg | ORAL_CAPSULE | Freq: Three times a day (TID) | ORAL | 0 refills | Status: DC | PRN
Start: 2023-03-16 — End: 2023-04-02

## 2023-03-16 NOTE — Progress Notes (Signed)
Virtual Visit Consent   Kari Brandt, you are scheduled for a virtual visit with a [facility] provider today. Just as with appointments in the office, your consent must be obtained to participate. Your consent will be active for this visit and any virtual visit you may have with one of our providers in the next 365 days. If you have a MyChart account, a copy of this consent can be sent to you electronically.  As this is a virtual visit, video technology does not allow for your provider to perform a traditional examination. This may limit your provider's ability to fully assess your condition. If your provider identifies any concerns that need to be evaluated in person or the need to arrange testing (such as labs, EKG, etc.), we will make arrangements to do so. Although advances in technology are sophisticated, we cannot ensure that it will always work on either your end or our end. If the connection with a video visit is poor, the visit may have to be switched to a telephone visit. With either a video or telephone visit, we are not always able to ensure that we have a secure connection.  By engaging in this virtual visit, you consent to the provision of healthcare and authorize for your insurance to be billed (if applicable) for the services provided during this visit. Depending on your insurance coverage, you may receive a charge related to this service.  I need to obtain your verbal consent now. Are you willing to proceed with your visit today? Kari Brandt has provided verbal consent on 03/16/2023 for a virtual visit (video or telephone). Margaretann Loveless, PA-C  Date: 03/16/2023 8:45 AM  Virtual Visit via Video Note   I, Margaretann Loveless, connected with  Kari Brandt  ([id number], 12/14/1970) on 03/16/23 at  8:30 AM EST by a video-enabled telemedicine application and verified that I am speaking with the correct person using two identifiers.  Location: Patient:  Virtual Visit Location Patient: Home Provider: Virtual Visit Location Provider: Home Office   I discussed the limitations of evaluation and management by telemedicine and the availability of in person appointments. The patient expressed understanding and agreed to proceed.    Interactive audio and video communications were attempted, although failed due to patient's inability to connect to video. Continued visit with audio only interaction with patient agreement.   History of Present Illness: Kari Brandt is a 52 y.o. who identifies as a female who was assigned female at birth, and is being seen today for cough.  HPI: Cough This is a new problem. The current episode started 1 to 4 weeks ago (2 weeks). The problem has been gradually worsening. The problem occurs every few minutes. The cough is Productive of sputum and productive of purulent sputum (thick). Associated symptoms include nasal congestion, postnasal drip, rhinorrhea, a sore throat (initially) and wheezing (mild). Pertinent negatives include no chills, ear congestion, ear pain, fever, headaches, myalgias or shortness of breath. Associated symptoms comments: Sinus pressure. The symptoms are aggravated by lying down. Treatments tried: dayquil, nyquil, sinus relief pill. The treatment provided no relief. Her past medical history is significant for asthma (possibly as a kid). There is no history of bronchitis, environmental allergies or pneumonia.     Problems:  Patient Active Problem List   Diagnosis Date Noted   Controlled diabetes mellitus type 2 with complications (HCC) 12/30/2022   Mixed hyperlipidemia 12/04/2020   Elevated troponin    Chest pain 07/09/2020  Morbid obesity (HCC) 02/04/2020   Vitamin D deficiency 02/04/2020   Migraine 04/20/2019   Hypertension    Anxiety    Insomnia    GERD (gastroesophageal reflux disease)     Allergies: No Known Allergies Medications:  Current Outpatient Medications:    albuterol  (VENTOLIN HFA) 108 (90 Base) MCG/ACT inhaler, Inhale 1-2 puffs into the lungs every 6 (six) hours as needed., Disp: 8 g, Rfl: 0   amoxicillin-clavulanate (AUGMENTIN) 875-125 MG tablet, Take 1 tablet by mouth 2 (two) times daily., Disp: 20 tablet, Rfl: 0   benzonatate (TESSALON) 100 MG capsule, Take 1-2 capsules (100-200 mg total) by mouth 3 (three) times daily as needed., Disp: 30 capsule, Rfl: 0   promethazine-dextromethorphan (PROMETHAZINE-DM) 6.25-15 MG/5ML syrup, Take 5 mLs by mouth 4 (four) times daily as needed., Disp: 118 mL, Rfl: 0   amLODipine (NORVASC) 2.5 MG tablet, Take 1 tablet (2.5 mg total) by mouth daily., Disp: 90 tablet, Rfl: 1   Ascorbic Acid (VITAMIN C) 1000 MG tablet, Take 1,000 mg by mouth daily., Disp: , Rfl:    ASPIRIN 81 PO, Take 1 tablet by mouth daily., Disp: , Rfl:    busPIRone (BUSPAR) 10 MG tablet, TAKE 1 TABLET BY MOUTH TWICE DAILY AS NEEDED FOR ANXIETY/PANIC, Disp: 180 tablet, Rfl: 3   hydrochlorothiazide (HYDRODIURIL) 25 MG tablet, TAKE 1 TABLET BY MOUTH ONCE DAILY FOR HIGH BLOOD PRESSURE, Disp: 90 tablet, Rfl: 1   lisinopril (ZESTRIL) 40 MG tablet, Take 1 tablet (40 mg total) by mouth daily., Disp: 90 tablet, Rfl: 1   loratadine (CLARITIN) 10 MG tablet, Take 10 mg by mouth daily., Disp: , Rfl:    metFORMIN (GLUCOPHAGE) 500 MG tablet, Take 1 tablet (500 mg total) by mouth 2 (two) times daily with a meal., Disp: 180 tablet, Rfl: 1   rosuvastatin (CRESTOR) 5 MG tablet, Take 1 tablet (5 mg total) by mouth daily., Disp: 90 tablet, Rfl: 1   VITAMIN D PO, Take 500 mg by mouth daily., Disp: , Rfl:   Observations/Objective: Patient is well-developed, well-nourished in no acute distress.  Resting comfortably at home.  Head is normocephalic, atraumatic.  No labored breathing.  Speech is clear and coherent with logical content.  Patient is alert and oriented at baseline.    Assessment and Plan: 1. Acute bacterial bronchitis (Primary) - amoxicillin-clavulanate  (AUGMENTIN) 875-125 MG tablet; Take 1 tablet by mouth 2 (two) times daily.  Dispense: 20 tablet; Refill: 0 - benzonatate (TESSALON) 100 MG capsule; Take 1-2 capsules (100-200 mg total) by mouth 3 (three) times daily as needed.  Dispense: 30 capsule; Refill: 0 - promethazine-dextromethorphan (PROMETHAZINE-DM) 6.25-15 MG/5ML syrup; Take 5 mLs by mouth 4 (four) times daily as needed.  Dispense: 118 mL; Refill: 0 - albuterol (VENTOLIN HFA) 108 (90 Base) MCG/ACT inhaler; Inhale 1-2 puffs into the lungs every 6 (six) hours as needed.  Dispense: 8 g; Refill: 0  - Worsening over a week despite OTC medications - Will treat with Augmentin, Albuterol, Promethazine DM and tessalon perles - Can continue Mucinex  - Push fluids.  - Rest.  - Steam and humidifier can help - Seek in person evaluation if worsening or symptoms fail to improve    Follow Up Instructions: I discussed the assessment and treatment plan with the patient. The patient was provided an opportunity to ask questions and all were answered. The patient agreed with the plan and demonstrated an understanding of the instructions.  A copy of instructions were sent to the patient via MyChart  unless otherwise noted below.    The patient was advised to call back or seek an in-person evaluation if the symptoms worsen or if the condition fails to improve as anticipated.   I have spent 16 minutes in review and updating patient chart, medical decision making and discussing treatment plan with patient for URI symptoms.   Margaretann Loveless, PA-C   Margaretann Loveless, [place]

## 2023-03-16 NOTE — Patient Instructions (Signed)
Kari Brandt, thank you for joining Margaretann Loveless, PA-C for today's virtual visit.  While this provider is not your primary care provider (PCP), if your PCP is located in our provider database this encounter information will be shared with them immediately following your visit.   A [facility] MyChart account gives you access to today's visit and all your visits, tests, and labs performed at [facility] " click here if you don't have a [facility] MyChart account or go to [email or web address]  Consent: (Patient) Kari Brandt provided verbal consent for this virtual visit at the beginning of the encounter.  Current Medications:  Current Outpatient Medications:    albuterol (VENTOLIN HFA) 108 (90 Base) MCG/ACT inhaler, Inhale 1-2 puffs into the lungs every 6 (six) hours as needed., Disp: 8 g, Rfl: 0   amoxicillin-clavulanate (AUGMENTIN) 875-125 MG tablet, Take 1 tablet by mouth 2 (two) times daily., Disp: 20 tablet, Rfl: 0   benzonatate (TESSALON) 100 MG capsule, Take 1-2 capsules (100-200 mg total) by mouth 3 (three) times daily as needed., Disp: 30 capsule, Rfl: 0   promethazine-dextromethorphan (PROMETHAZINE-DM) 6.25-15 MG/5ML syrup, Take 5 mLs by mouth 4 (four) times daily as needed., Disp: 118 mL, Rfl: 0   amLODipine (NORVASC) 2.5 MG tablet, Take 1 tablet (2.5 mg total) by mouth daily., Disp: 90 tablet, Rfl: 1   Ascorbic Acid (VITAMIN C) 1000 MG tablet, Take 1,000 mg by mouth daily., Disp: , Rfl:    ASPIRIN 81 PO, Take 1 tablet by mouth daily., Disp: , Rfl:    busPIRone (BUSPAR) 10 MG tablet, TAKE 1 TABLET BY MOUTH TWICE DAILY AS NEEDED FOR ANXIETY/PANIC, Disp: 180 tablet, Rfl: 3   hydrochlorothiazide (HYDRODIURIL) 25 MG tablet, TAKE 1 TABLET BY MOUTH ONCE DAILY FOR HIGH BLOOD PRESSURE, Disp: 90 tablet, Rfl: 1   lisinopril (ZESTRIL) 40 MG tablet, Take 1 tablet (40 mg total) by mouth daily., Disp: 90 tablet, Rfl: 1   loratadine (CLARITIN) 10 MG tablet,  Take 10 mg by mouth daily., Disp: , Rfl:    metFORMIN (GLUCOPHAGE) 500 MG tablet, Take 1 tablet (500 mg total) by mouth 2 (two) times daily with a meal., Disp: 180 tablet, Rfl: 1   rosuvastatin (CRESTOR) 5 MG tablet, Take 1 tablet (5 mg total) by mouth daily., Disp: 90 tablet, Rfl: 1   VITAMIN D PO, Take 500 mg by mouth daily., Disp: , Rfl:    Medications ordered in this encounter:  Meds ordered this encounter  Medications   amoxicillin-clavulanate (AUGMENTIN) 875-125 MG tablet    Sig: Take 1 tablet by mouth 2 (two) times daily.    Dispense:  20 tablet    Refill:  0    Supervising Provider:   Merrilee Jansky [[id number]]   benzonatate (TESSALON) 100 MG capsule    Sig: Take 1-2 capsules (100-200 mg total) by mouth 3 (three) times daily as needed.    Dispense:  30 capsule    Refill:  0    Supervising Provider:   Merrilee Jansky [[id number]]   promethazine-dextromethorphan (PROMETHAZINE-DM) 6.25-15 MG/5ML syrup    Sig: Take 5 mLs by mouth 4 (four) times daily as needed.    Dispense:  118 mL    Refill:  0    Supervising Provider:   Merrilee Jansky [[id number]]   albuterol (VENTOLIN HFA) 108 (90 Base) MCG/ACT inhaler    Sig: Inhale 1-2 puffs into the lungs every 6 (six) hours as needed.    Dispense:  8 g  Refill:  0    Supervising Provider:   Merrilee Jansky [[id number]]     *If you need refills on other medications prior to your next appointment, please contact your pharmacy*  Follow-Up: Call back or seek an in-person evaluation if the symptoms worsen or if the condition fails to improve as anticipated.  [facility] [phone]  Other Instructions Acute Bronchitis, Adult  Acute bronchitis is sudden inflammation of the main airways (bronchi) that come off the windpipe (trachea) in the lungs. The swelling causes the airways to get smaller and make more mucus than normal. This can make it hard to breathe and can cause coughing or noisy breathing (wheezing). Acute  bronchitis may last several weeks. The cough may last longer. Allergies, asthma, and exposure to smoke may make the condition worse. What are the causes? This condition can be caused by germs and by substances that irritate the lungs, including: Cold and flu viruses. The most common cause of this condition is the virus that causes the common cold. Bacteria. This is less common. Breathing in substances that irritate the lungs, including: Smoke from cigarettes and other forms of tobacco. Dust and pollen. Fumes from household cleaning products, gases, or burned fuel. Indoor or outdoor air pollution. What increases the risk? The following factors may make you more likely to develop this condition: A weak body's defense system, also called the immune system. A condition that affects your lungs and breathing, such as asthma. What are the signs or symptoms? Common symptoms of this condition include: Coughing. This may bring up clear, yellow, or green mucus from your lungs (sputum). Wheezing. Runny or stuffy nose. Having too much mucus in your lungs (chest congestion). Shortness of breath. Aches and pains, including sore throat or chest. How is this diagnosed? This condition is usually diagnosed based on: Your symptoms and medical history. A physical exam. You may also have other tests, including tests to rule out other conditions, such as pneumonia. These tests include: A test of lung function. Test of a mucus sample to look for the presence of bacteria. Tests to check the oxygen level in your blood. Blood tests. Chest X-ray. How is this treated? Most cases of acute bronchitis clear up over time without treatment. Your health care provider may recommend: Drinking more fluids to help thin your mucus so it is easier to cough up. Taking inhaled medicine (inhaler) to improve air flow in and out of your lungs. Using a vaporizer or a humidifier. These are machines that add water to the air to  help you breathe better. Taking a medicine that thins mucus and clears congestion (expectorant). Taking a medicine that prevents or stops coughing (cough suppressant). It is not common to take an antibiotic medicine for this condition. Follow these instructions at home:  Take over-the-counter and prescription medicines only as told by your health care provider. Use an inhaler, vaporizer, or humidifier as told by your health care provider. Take two teaspoons (10 mL) of honey at bedtime to lessen coughing at night. Drink enough fluid to keep your urine pale yellow. Do not use any products that contain nicotine or tobacco. These products include cigarettes, chewing tobacco, and vaping devices, such as e-cigarettes. If you need help quitting, ask your health care provider. Get plenty of rest. Return to your normal activities as told by your health care provider. Ask your health care provider what activities are safe for you. Keep all follow-up visits. This is important. How is this  prevented? To lower your risk of getting this condition again: Wash your hands often with soap and water for at least 20 seconds. If soap and water are not available, use hand sanitizer. Avoid contact with people who have cold symptoms. Try not to touch your mouth, nose, or eyes with your hands. Avoid breathing in smoke or chemical fumes. Breathing smoke or chemical fumes will make your condition worse. Get the flu shot every year. Contact a health care provider if: Your symptoms do not improve after 2 weeks. You have trouble coughing up the mucus. Your cough keeps you awake at night. You have a fever. Get help right away if you: Cough up blood. Feel pain in your chest. Have severe shortness of breath. Faint or keep feeling like you are going to faint. Have a severe headache. Have a fever or chills that get worse. These symptoms may represent a serious problem that is an emergency. Do not wait to see if the  symptoms will go away. Get medical help right away. Call your local emergency services (911 in the [place] Do not drive yourself to the hospital. Summary Acute bronchitis is inflammation of the main airways (bronchi) that come off the windpipe (trachea) in the lungs. The swelling causes the airways to get smaller and make more mucus than normal. Drinking more fluids can help thin your mucus so it is easier to cough up. Take over-the-counter and prescription medicines only as told by your health care provider. Do not use any products that contain nicotine or tobacco. These products include cigarettes, chewing tobacco, and vaping devices, such as e-cigarettes. If you need help quitting, ask your health care provider. Contact a health care provider if your symptoms do not improve after 2 weeks. This information is not intended to replace advice given to you by your health care provider. Make sure you discuss any questions you have with your health care provider. Document Revised: 06/19/2021 Document Reviewed: 07/10/2020 Elsevier Patient Education  2024 Elsevier Inc.    If you have been instructed to have an in-person evaluation today at a local Urgent Care facility, please use the link below. It will take you to a list of all of our available [facility], including address, phone number and hours of operation. Please do not delay care.  [facility]  If you or a family member do not have a primary care provider, use the link below to schedule a visit and establish care. When you choose a [facility] primary care physician or advanced practice provider, you gain a long-term partner in health. Find a Primary Care Provider  Learn more about [facility] in-office and virtual care options: [facility] - Get Care Now

## 2023-04-02 ENCOUNTER — Encounter: Payer: Self-pay | Admitting: Nurse Practitioner

## 2023-04-02 ENCOUNTER — Ambulatory Visit: Payer: BC Managed Care – PPO | Admitting: Nurse Practitioner

## 2023-04-02 VITALS — BP 136/85 | HR 76 | Temp 98.3°F | Ht 61.25 in | Wt 221.4 lb

## 2023-04-02 DIAGNOSIS — I10 Essential (primary) hypertension: Secondary | ICD-10-CM | POA: Diagnosis not present

## 2023-04-02 DIAGNOSIS — F419 Anxiety disorder, unspecified: Secondary | ICD-10-CM

## 2023-04-02 DIAGNOSIS — Z7985 Long-term (current) use of injectable non-insulin antidiabetic drugs: Secondary | ICD-10-CM

## 2023-04-02 DIAGNOSIS — E782 Mixed hyperlipidemia: Secondary | ICD-10-CM

## 2023-04-02 DIAGNOSIS — E118 Type 2 diabetes mellitus with unspecified complications: Secondary | ICD-10-CM

## 2023-04-02 MED ORDER — OZEMPIC (0.25 OR 0.5 MG/DOSE) 2 MG/1.5ML ~~LOC~~ SOPN: 0.2500 mg | PEN_INJECTOR | SUBCUTANEOUS | 1 refills | Status: DC

## 2023-04-02 MED ORDER — TRIAMCINOLONE ACETONIDE 0.1 % EX CREA: 1.0000 | TOPICAL_CREAM | Freq: Two times a day (BID) | CUTANEOUS | 1 refills | Status: AC

## 2023-04-02 MED ORDER — BUSPIRONE HCL 15 MG PO TABS: 15.0000 mg | ORAL_TABLET | Freq: Two times a day (BID) | ORAL | 1 refills | Status: DC

## 2023-04-02 NOTE — Assessment & Plan Note (Signed)
 Chronic.  Continue with Rosuvastatin.  Would benefit from increasing dose in the future.  Labs ordered.  Vaccines up to date.

## 2023-04-02 NOTE — Assessment & Plan Note (Signed)
 Recommended eating smaller high protein, low fat meals more frequently and exercising 30 mins a day 5 times a week with a goal of 10-15lb weight loss in the next 3 months.

## 2023-04-02 NOTE — Assessment & Plan Note (Signed)
Chronic. Controlled in office today.  Continue with Lisinopril 40mg , Amlodipine 2.5mg  and hydrochlorothiazide 25mg .  Recommend checking blood pressure daily at home and keeping a log.  Bring log to next visit.  Follow up in 3 months.  Call sooner if concerns arise.

## 2023-04-02 NOTE — Progress Notes (Signed)
 BP 136/85 (BP Location: Right Arm, Patient Position: Sitting, Cuff Size: Large)   Pulse 76   Temp 98.3 F (36.8 C) (Oral)   Ht 5' 1.25 (1.556 m)   Wt 221 lb 6.4 oz (100.4 kg)   SpO2 98%   BMI 41.49 kg/m    Subjective:    Patient ID: Kari Brandt, female    DOB: 1970/09/11, 53 y.o.   MRN: [id number]  HPI: Kari Brandt is a 53 y.o. female  Chief Complaint  Patient presents with   Hypertension   Hyperlipidemia   Diabetes   3 month follow up   HYPERTENSION without Chronic Kidney Disease Hypertension status: controlled  Satisfied with current treatment? yes Duration of hypertension: years BP monitoring frequency:  not checking  BP range:  BP medication side effects:  no Medication compliance: excellent compliance Previous BP meds:HCTZ and lisinopril , amlodipine  Aspirin : no Recurrent headaches: no Visual changes: no Palpitations: no Dyspnea: no Chest pain: no Lower extremity edema: no Dizzy/lightheaded: no  DIABETES Metformin  500mg  BID.  Hypoglycemic episodes:no Polydipsia/polyuria: no Visual disturbance: no Chest pain: no Paresthesias: no Glucose Monitoring: no  Accucheck frequency: Not Checking  Fasting glucose:  Post prandial:  Evening:  Before meals: Taking Insulin?: no  Long acting insulin:  Short acting insulin: Blood Pressure Monitoring: weekly Retinal Examination:  Will schedule with [facility] Foot Exam: Up to Date Diabetic Education: Not Completed Pneumovax: Up to Date Influenza: Up to Date Aspirin : no  ANXIETY Patient states her anxiety has been alright.  She takes the buspar  at night to help her settle down before bed.  Doesn't feel like it is helping her very much. Denies concerns regarding anxiety today.      Relevant past medical, surgical, family and social history reviewed and updated as indicated. Interim medical history since our last visit reviewed. Allergies and medications reviewed and updated.  Review of Systems   Eyes:  Negative for visual disturbance.  Respiratory:  Negative for cough, chest tightness and shortness of breath.   Cardiovascular:  Negative for chest pain, palpitations and leg swelling.  Endocrine: Negative for polydipsia and polyuria.  Neurological:  Negative for dizziness, numbness and headaches.  Psychiatric/Behavioral:  Positive for dysphoric mood. Negative for suicidal ideas. The patient is nervous/anxious.     Per HPI unless specifically indicated above     Objective:    BP 136/85 (BP Location: Right Arm, Patient Position: Sitting, Cuff Size: Large)   Pulse 76   Temp 98.3 F (36.8 C) (Oral)   Ht 5' 1.25 (1.556 m)   Wt 221 lb 6.4 oz (100.4 kg)   SpO2 98%   BMI 41.49 kg/m   Wt Readings from Last 3 Encounters:  04/02/23 221 lb 6.4 oz (100.4 kg)  12/31/22 227 lb 9.6 oz (103.2 kg)  10/26/22 232 lb 9.6 oz (105.5 kg)    Physical Exam Vitals and nursing note reviewed.  Constitutional:      General: She is not in acute distress.    Appearance: Normal appearance. She is obese. She is not ill-appearing, toxic-appearing or diaphoretic.  HENT:     Head: Normocephalic.     Right Ear: External ear normal.     Left Ear: External ear normal.     Nose: Nose normal.     Mouth/Throat:     Mouth: Mucous membranes are moist.     Pharynx: Oropharynx is clear.  Eyes:     General:        Right eye: No discharge.  Left eye: No discharge.     Extraocular Movements: Extraocular movements intact.     Conjunctiva/sclera: Conjunctivae normal.     Pupils: Pupils are equal, round, and reactive to light.  Cardiovascular:     Rate and Rhythm: Normal rate and regular rhythm.     Heart sounds: No murmur heard. Pulmonary:     Effort: Pulmonary effort is normal. No respiratory distress.     Breath sounds: Normal breath sounds. No wheezing or rales.  Musculoskeletal:     Cervical back: Normal range of motion and neck supple.  Skin:    General: Skin is warm and dry.     Capillary  Refill: Capillary refill takes less than 2 seconds.  Neurological:     General: No focal deficit present.     Mental Status: She is alert and oriented to person, place, and time. Mental status is at baseline.  Psychiatric:        Mood and Affect: Mood normal.        Behavior: Behavior normal.        Thought Content: Thought content normal.        Judgment: Judgment normal.     Results for orders placed or performed in visit on 12/31/22  Urinalysis, Routine w reflex microscopic   Collection Time: 12/31/22 11:11 AM  Result Value Ref Range   Specific Gravity, UA >1.030 (H) 1.005 - 1.030   pH, UA 5.5 5.0 - 7.5   Color, UA Yellow Yellow   Appearance Ur Clear Clear   Leukocytes,UA Negative Negative   Protein,UA Negative Negative/Trace   Glucose, UA Negative Negative   Ketones, UA Negative Negative   RBC, UA Negative Negative   Bilirubin, UA Negative Negative   Urobilinogen, Ur 0.2 0.2 - 1.0 mg/dL   Nitrite, UA Negative Negative   Microscopic Examination Comment   Vitamin D  (25 hydroxy)   Collection Time: 12/31/22 11:15 AM  Result Value Ref Range   Vit D, 25-Hydroxy 35.1 30.0 - 100.0 ng/mL  Microalbumin, Urine Waived   Collection Time: 12/31/22 11:15 AM  Result Value Ref Range   Microalb, Ur Waived 10 0 - 19 mg/L   Creatinine, Urine Waived 300 10 - 300 mg/dL   Microalb/Creat Ratio <30 <30 mg/g  Comprehensive metabolic panel   Collection Time: 12/31/22 11:15 AM  Result Value Ref Range   Glucose 85 70 - 99 mg/dL   BUN 12 6 - 24 mg/dL   Creatinine, Ser 9.20 0.57 - 1.00 mg/dL   eGFR 91 >40 fO/fpw/8.26   BUN/Creatinine Ratio 15 9 - 23   Sodium 144 134 - 144 mmol/L   Potassium 3.8 3.5 - 5.2 mmol/L   Chloride 103 96 - 106 mmol/L   CO2 25 20 - 29 mmol/L   Calcium  9.6 8.7 - 10.2 mg/dL   Total Protein 6.9 6.0 - 8.5 g/dL   Albumin 4.7 3.9 - 4.9 g/dL   Globulin, Total 2.2 1.5 - 4.5 g/dL   Bilirubin Total 0.4 0.0 - 1.2 mg/dL   Alkaline Phosphatase 129 (H) 44 - 121 IU/L   AST 19 0  - 40 IU/L   ALT 21 0 - 32 IU/L  Lipid panel   Collection Time: 12/31/22 11:15 AM  Result Value Ref Range   Cholesterol, Total 203 (H) 100 - 199 mg/dL   Triglycerides 841 (H) 0 - 149 mg/dL   HDL 38 (L) >60 mg/dL   VLDL Cholesterol Cal 29 5 - 40 mg/dL   LDL Chol Calc (  NIH) 136 (H) 0 - 99 mg/dL   Chol/HDL Ratio 5.3 (H) 0.0 - 4.4 ratio  Hemoglobin A1c   Collection Time: 12/31/22 11:15 AM  Result Value Ref Range   Hgb A1c MFr Bld 6.5 (H) 4.8 - 5.6 %   Est. average glucose Bld gHb Est-mCnc 140 mg/dL      Assessment & Plan:   Problem List Items Addressed This Visit       Cardiovascular and Mediastinum   Hypertension - Primary   Chronic. Controlled in office today.  Continue with Lisinopril  40mg , Amlodipine  2.5mg  and hydrochlorothiazide  25mg .  Recommend checking blood pressure daily at home and keeping a log.  Bring log to next visit.  Follow up in 3 months.  Call sooner if concerns arise.       Relevant Orders   Comp Met (CMET)     Endocrine   Controlled diabetes mellitus type 2 with complications (HCC)   Chronic.  Controlled.  Last A1c was 6.5%.  Doing well with Metformin .  Will add Ozempic  for kidney protection and weight loss benefits.  Discussed side effects and benefits of medication during visit today.  Discussed how to give injection.  Continue with current medication regimen.  Labs ordered today.  Return to clinic in 3 months for reevaluation.  Call sooner if concerns arise.        Relevant Medications   Semaglutide ,0.25 or 0.5MG /DOS, (OZEMPIC , 0.25 OR 0.5 MG/DOSE,) 2 MG/1.5ML SOPN   Other Relevant Orders   HgB A1c     Other   Anxiety   Chronic.  Ongoing problem.  Will increase dose of Buspar  to 15mg  daily.  Follow up in 3 months.  Call sooner if concerns arise.       Relevant Medications   busPIRone  (BUSPAR ) 15 MG tablet   Morbid obesity (HCC)   Recommended eating smaller high protein, low fat meals more frequently and exercising 30 mins a day 5 times a week with a  goal of 10-15lb weight loss in the next 3 months.       Relevant Medications   Semaglutide ,0.25 or 0.5MG /DOS, (OZEMPIC , 0.25 OR 0.5 MG/DOSE,) 2 MG/1.5ML SOPN   Mixed hyperlipidemia   Chronic.  Continue with Rosuvastatin .  Would benefit from increasing dose in the future.  Labs ordered.  Vaccines up to date.      Relevant Orders   Lipid Profile      Follow up plan: Return in about 3 months (around 07/01/2023) for HTN, HLD, DM2 FU.

## 2023-04-02 NOTE — Assessment & Plan Note (Signed)
 Chronic.  Ongoing problem.  Will increase dose of Buspar to 15mg  daily.  Follow up in 3 months.  Call sooner if concerns arise.

## 2023-04-02 NOTE — Assessment & Plan Note (Addendum)
 Chronic.  Controlled.  Last A1c was 6.5%.  Doing well with Metformin .  Will add Ozempic  for kidney protection and weight loss benefits.  Discussed side effects and benefits of medication during visit today.  Discussed how to give injection.  Continue with current medication regimen.  Labs ordered today.  Return to clinic in 3 months for reevaluation.  Call sooner if concerns arise.

## 2023-04-03 LAB — COMPREHENSIVE METABOLIC PANEL
ALT: 37 [IU]/L — ABNORMAL HIGH (ref 0–32)
AST: 21 [IU]/L (ref 0–40)
Albumin: 4.5 g/dL (ref 3.9–4.9)
Alkaline Phosphatase: 154 [IU]/L — ABNORMAL HIGH (ref 44–121)
BUN/Creatinine Ratio: 17 (ref 9–23)
BUN: 12 mg/dL (ref 6–24)
Bilirubin Total: 0.3 mg/dL (ref 0.0–1.2)
CO2: 24 mmol/L (ref 20–29)
Calcium: 9.6 mg/dL (ref 8.7–10.2)
Chloride: 102 mmol/L (ref 96–106)
Creatinine, Ser: 0.7 mg/dL (ref 0.57–1.00)
Globulin, Total: 2.3 g/dL (ref 1.5–4.5)
Glucose: 86 mg/dL (ref 70–99)
Potassium: 4.3 mmol/L (ref 3.5–5.2)
Sodium: 143 mmol/L (ref 134–144)
Total Protein: 6.8 g/dL (ref 6.0–8.5)
eGFR: 105 mL/min/{1.73_m2} (ref >59)

## 2023-04-03 LAB — HEMOGLOBIN A1C
Est. average glucose Bld gHb Est-mCnc: 134 mg/dL
Hgb A1c MFr Bld: 6.3 % — ABNORMAL HIGH (ref 4.8–5.6)

## 2023-04-03 LAB — LIPID PANEL
Chol/HDL Ratio: 4 {ratio} (ref 0.0–4.4)
Cholesterol, Total: 160 mg/dL (ref 100–199)
HDL: 40 mg/dL (ref >39)
LDL Chol Calc (NIH): 85 mg/dL (ref 0–99)
Triglycerides: 205 mg/dL — ABNORMAL HIGH (ref 0–149)
VLDL Cholesterol Cal: 35 mg/dL (ref 5–40)

## 2023-04-05 ENCOUNTER — Telehealth: Payer: Self-pay

## 2023-04-05 NOTE — Telephone Encounter (Signed)
 PA for Ozempic initiated and submitted via Cover My Meds. Key: [id number]

## 2023-04-07 NOTE — Telephone Encounter (Signed)
 PA has been approved

## 2023-06-22 ENCOUNTER — Other Ambulatory Visit: Payer: Self-pay | Admitting: Nurse Practitioner

## 2023-06-24 NOTE — Telephone Encounter (Signed)
 Requested Prescriptions  Pending Prescriptions Disp Refills   lisinopril (ZESTRIL) 40 MG tablet [Pharmacy Med Name: Lisinopril 40 MG Oral Tablet] 90 tablet 0    Sig: Take 1 tablet by mouth once daily     Cardiovascular:  ACE Inhibitors Failed - 06/24/2023  9:19 AM      Failed - Valid encounter within last 6 months    Recent Outpatient Visits   None     Future Appointments             In 1 week Kari Grooms, NP [facility], [facility]            Passed - Cr in normal range and within 180 days    Creatinine, Ser  Date Value Ref Range Status  04/02/2023 0.70 0.57 - 1.00 mg/dL Final         Passed - K in normal range and within 180 days    Potassium  Date Value Ref Range Status  04/02/2023 4.3 3.5 - 5.2 mmol/L Final         Passed - Patient is not pregnant      Passed - Last BP in normal range    BP Readings from Last 1 Encounters:  04/02/23 136/85          rosuvastatin (CRESTOR) 5 MG tablet [Pharmacy Med Name: Rosuvastatin Calcium 5 MG Oral Tablet] 90 tablet 0    Sig: Take 1 tablet by mouth once daily     Cardiovascular:  Antilipid - Statins 2 Failed - 06/24/2023  9:19 AM      Failed - Valid encounter within last 12 months    Recent Outpatient Visits   None     Future Appointments             In 1 week Kari Grooms, NP [facility], [facility]            Failed - Lipid Panel in normal range within the last 12 months    Cholesterol, Total  Date Value Ref Range Status  04/02/2023 160 100 - 199 mg/dL Final   LDL Chol Calc (NIH)  Date Value Ref Range Status  04/02/2023 85 0 - 99 mg/dL Final   HDL  Date Value Ref Range Status  04/02/2023 40 >39 mg/dL Final   Triglycerides  Date Value Ref Range Status  04/02/2023 205 (H) 0 - 149 mg/dL Final         Passed - Cr in normal range and within 360 days    Creatinine, Ser  Date Value Ref Range Status  04/02/2023 0.70 0.57 - 1.00 mg/dL Final         Passed -  Patient is not pregnant       metFORMIN (GLUCOPHAGE) 500 MG tablet [Pharmacy Med Name: metFORMIN HCl 500 MG Oral Tablet] 180 tablet 0    Sig: TAKE 1 TABLET BY MOUTH TWICE DAILY WITH A MEAL     Endocrinology:  Diabetes - Biguanides Failed - 06/24/2023  9:19 AM      Failed - B12 Level in normal range and within 720 days    Vitamin B-12  Date Value Ref Range Status  12/04/2020 338 232 - 1,245 pg/mL Final         Failed - Valid encounter within last 6 months    Recent Outpatient Visits   None     Future Appointments             In 1  week Kari Grooms, NP [facility], [facility]            Failed - CBC within normal limits and completed in the last 12 months    WBC  Date Value Ref Range Status  06/04/2022 7.0 3.4 - 10.8 x10E3/uL Final  10/14/2020 6.0 4.0 - 10.5 K/uL Final   RBC  Date Value Ref Range Status  06/04/2022 4.39 3.77 - 5.28 x10E6/uL Final  10/14/2020 4.33 3.87 - 5.11 MIL/uL Final   Hemoglobin  Date Value Ref Range Status  06/04/2022 13.1 11.1 - 15.9 g/dL Final   Hematocrit  Date Value Ref Range Status  06/04/2022 40.1 34.0 - 46.6 % Final   MCHC  Date Value Ref Range Status  06/04/2022 32.7 31.5 - 35.7 g/dL Final  09/81/1914 78.2 30.0 - 36.0 g/dL Final   [facility]  Date Value Ref Range Status  06/04/2022 29.8 26.6 - 33.0 pg Final  10/14/2020 29.6 26.0 - 34.0 pg Final   MCV  Date Value Ref Range Status  06/04/2022 91 79 - 97 fL Final   No results found for: "PLTCOUNTKUC", "LABPLAT", "POCPLA" RDW  Date Value Ref Range Status  06/04/2022 13.2 11.7 - 15.4 % Final         Passed - Cr in normal range and within 360 days    Creatinine, Ser  Date Value Ref Range Status  04/02/2023 0.70 0.57 - 1.00 mg/dL Final         Passed - HBA1C is between 0 and 7.9 and within 180 days    HB A1C (BAYER DCA - WAIVED)  Date Value Ref Range Status  10/26/2022 6.5 (H) 4.8 - 5.6 % Final    Comment:             Prediabetes: 5.7 - 6.4           Diabetes: >6.4          Glycemic control for adults with diabetes: <7.0    Hgb A1c MFr Bld  Date Value Ref Range Status  04/02/2023 6.3 (H) 4.8 - 5.6 % Final    Comment:             Prediabetes: 5.7 - 6.4          Diabetes: >6.4          Glycemic control for adults with diabetes: <7.0          Passed - eGFR in normal range and within 360 days    GFR calc Af Amer  Date Value Ref Range Status  03/06/2020 85 >59 mL/min/1.73 Final    Comment:    **In accordance with recommendations from the NKF-ASN Task force,**   [facility] is in the process of updating its eGFR calculation to the   2021 CKD-EPI creatinine equation that estimates kidney function   without a race variable.    GFR, Estimated  Date Value Ref Range Status  10/14/2020 >60 >60 mL/min Final    Comment:    (NOTE) Calculated using the CKD-EPI Creatinine Equation (2021)    eGFR  Date Value Ref Range Status  04/02/2023 105 >59 mL/min/1.73 Final

## 2023-07-01 ENCOUNTER — Encounter: Payer: Self-pay | Admitting: Nurse Practitioner

## 2023-07-01 ENCOUNTER — Ambulatory Visit: Payer: Self-pay | Admitting: Nurse Practitioner

## 2023-07-01 VITALS — BP 123/77 | HR 68 | Temp 97.7°F | Resp 17 | Ht 61.26 in | Wt 214.4 lb

## 2023-07-01 DIAGNOSIS — E782 Mixed hyperlipidemia: Secondary | ICD-10-CM | POA: Diagnosis not present

## 2023-07-01 DIAGNOSIS — F419 Anxiety disorder, unspecified: Secondary | ICD-10-CM | POA: Diagnosis not present

## 2023-07-01 DIAGNOSIS — I10 Essential (primary) hypertension: Secondary | ICD-10-CM

## 2023-07-01 DIAGNOSIS — E119 Type 2 diabetes mellitus without complications: Secondary | ICD-10-CM | POA: Diagnosis not present

## 2023-07-01 DIAGNOSIS — E118 Type 2 diabetes mellitus with unspecified complications: Secondary | ICD-10-CM | POA: Diagnosis not present

## 2023-07-01 DIAGNOSIS — Z0000 Encounter for general adult medical examination without abnormal findings: Secondary | ICD-10-CM | POA: Diagnosis not present

## 2023-07-01 DIAGNOSIS — Z7985 Long-term (current) use of injectable non-insulin antidiabetic drugs: Secondary | ICD-10-CM

## 2023-07-01 MED ORDER — HYDROCORTISONE (PERIANAL) 2.5 % EX CREA: 1.0000 | TOPICAL_CREAM | Freq: Two times a day (BID) | CUTANEOUS | 0 refills | Status: AC

## 2023-07-01 MED ORDER — ROSUVASTATIN CALCIUM 10 MG PO TABS: 10.0000 mg | ORAL_TABLET | Freq: Every day | ORAL | 1 refills | Status: DC

## 2023-07-01 NOTE — Patient Instructions (Signed)
 Senna- for stool

## 2023-07-01 NOTE — Assessment & Plan Note (Signed)
Chronic. Controlled in office today.  Continue with Lisinopril 40mg , Amlodipine 2.5mg  and hydrochlorothiazide 25mg .  Recommend checking blood pressure daily at home and keeping a log.  Bring log to next visit.  Follow up in 3 months.  Call sooner if concerns arise.

## 2023-07-01 NOTE — Assessment & Plan Note (Signed)
 Chronic.  Controlled.  Last A1c was 5.3%.  Was not able to get Ozempic due to cost- referral placed for pharmacy team to assist with cost of medication.  Will increase dose of Rosuvastatin to 10mg .  Continue with Lisinopril for Kidney protection.  Discussed eye exam.  Continue with current medication regimen.  Labs ordered today.  Return to clinic in 3 months for reevaluation.  Call sooner if concerns arise.

## 2023-07-01 NOTE — Assessment & Plan Note (Signed)
 Chronic.  Controlled with medication.  However, patient has been out.  Referral placed for pharmacy team to help with cost of medications.  Continue with Buspar15mg  daily.  Follow up in 3 months.  Call sooner if concerns arise.

## 2023-07-01 NOTE — Progress Notes (Signed)
 BP 123/77 (BP Location: Left Arm, Patient Position: Sitting, Cuff Size: Large)   Pulse 68   Temp 97.7 F (36.5 C) (Oral)   Resp 17   Ht 5' 1.26" (1.556 m)   Wt 214 lb 6.4 oz (97.3 kg)   LMP  (LMP Unknown)   SpO2 98%   BMI 40.17 kg/m    Subjective:    Patient ID: Kari Brandt, female    DOB: 11-Jan-1971, 53 y.o.   MRN: [id number]  HPI: Kari Brandt is a 53 y.o. female presenting on 07/01/2023 for comprehensive medical examination. Current medical complaints include: Hemorrhoids  She currently lives with: her husband Menopausal Symptoms: yes  HYPERTENSION / HYPERLIPIDEMIA Satisfied with current treatment? yes Duration of hypertension: years BP monitoring frequency: not checking BP range:  BP medication side effects: no Past BP meds: lisinopril Duration of hyperlipidemia: years Cholesterol medication side effects: no Cholesterol supplements: none Past cholesterol medications: none Medication compliance: excellent compliance Aspirin: no Recent stressors: no Recurrent headaches: no Visual changes: no Palpitations: no Dyspnea: no Chest pain: no Lower extremity edema: no Dizzy/lightheaded: no  DIABETES Metformin 500mg  BID. She hasn't been able to get the Ozempic.   Hypoglycemic episodes:no Polydipsia/polyuria: no Visual disturbance: no Chest pain: no Paresthesias: no Glucose Monitoring: no  Accucheck frequency: Not Checking  Fasting glucose:  Post prandial:  Evening:  Before meals: Taking Insulin?: no  Long acting insulin:  Short acting insulin: Blood Pressure Monitoring: weekly Retinal Examination: Will schedule with [facility] Foot Exam: Up to Date Diabetic Education: Not Completed Pneumovax: Up to Date Influenza: Up to Date Aspirin: no  HEMORRHOIDS Patient states she has been having trouble with her Hemorrhoids.  They get better then worse again.  Using [facility] and a cream but not sure what the cream is called.   ANXIETY Patient states  her anxiety has been okay.  States on Saturday she ws at work and she felt dizzy then sat down and when she got home she kept feeling dizzy and light headed.  Her blood pressure ws fluctuating between high and then down to normal.  She felt off the rest of Saturday then on Sunday she woke up and felt back to normal.  She hasn't been taking the Buspar.  She has been off the medication for about a month.  She does feel like the buspar helps when she takes it.   Depression Screen done today and results listed below:     07/01/2023    8:35 AM 04/02/2023    9:47 AM 12/31/2022   10:46 AM 10/26/2022    8:25 AM 10/09/2022    9:55 AM  Depression screen PHQ 2/9  Decreased Interest 0 0 0 0 0  Down, Depressed, Hopeless 0 0 0 0 0  PHQ - 2 Score 0 0 0 0 0  Altered sleeping 1 1 1 1  0  Tired, decreased energy 0 0 1 1 1   Change in appetite 0 0 0 0 0  Feeling bad or failure about yourself  0 0 0 0 0  Trouble concentrating 0 0 0 0 0  Moving slowly or fidgety/restless 0 0 0 0 0  Suicidal thoughts 0 0 0 0 0  PHQ-9 Score 1 1 2 2 1   Difficult doing work/chores Not difficult at all  Not difficult at all  Not difficult at all    The patient does not have a history of falls. I did complete a risk assessment for falls. A plan of care  for falls was documented.   Past Medical History:  Past Medical History:  Diagnosis Date   Anxiety    GERD (gastroesophageal reflux disease)    Hypertension     Surgical History:  Past Surgical History:  Procedure Laterality Date   ENDOMETRIAL ABLATION  2007   her option   TUBAL LIGATION     WRIST SURGERY      Medications:  Current Outpatient Medications on File Prior to Visit  Medication Sig   albuterol (VENTOLIN HFA) 108 (90 Base) MCG/ACT inhaler Inhale 1-2 puffs into the lungs every 6 (six) hours as needed.   amLODipine (NORVASC) 2.5 MG tablet Take 1 tablet (2.5 mg total) by mouth daily.   Ascorbic Acid (VITAMIN C) 1000 MG tablet Take 1,000 mg by mouth daily.    ASPIRIN 81 PO Take 1 tablet by mouth daily.   busPIRone (BUSPAR) 15 MG tablet Take 1 tablet (15 mg total) by mouth 2 (two) times daily.   hydrochlorothiazide (HYDRODIURIL) 25 MG tablet TAKE 1 TABLET BY MOUTH ONCE DAILY FOR HIGH BLOOD PRESSURE   lisinopril (ZESTRIL) 40 MG tablet Take 1 tablet by mouth once daily   loratadine (CLARITIN) 10 MG tablet Take 10 mg by mouth daily.   metFORMIN (GLUCOPHAGE) 500 MG tablet TAKE 1 TABLET BY MOUTH TWICE DAILY WITH A MEAL   triamcinolone cream (KENALOG) 0.1 % Apply 1 Application topically 2 (two) times daily.   VITAMIN D PO Take 500 mg by mouth daily.   Semaglutide,0.25 or 0.5MG /DOS, (OZEMPIC, 0.25 OR 0.5 MG/DOSE,) 2 MG/1.5ML SOPN Inject 0.25 mg into the skin once a week. Start with 0.25MG  once a week x 4 weeks, then increase to 0.5MG  weekly. (Patient not taking: Reported on 07/01/2023)   No current facility-administered medications on file prior to visit.    Allergies:  No Known Allergies  Social History:  Social History   Socioeconomic History   Marital status: Married    Spouse name: Not on file   Number of children: Not on file   Years of education: Not on file   Highest education level: Some college, no degree  Occupational History   Not on file  Tobacco Use   Smoking status: Never   Smokeless tobacco: Never  Vaping Use   Vaping status: Never Used  Substance and Sexual Activity   Alcohol use: Yes    Comment: occassionally   Drug use: Never   Sexual activity: Yes    Birth control/protection: Surgical    Comment: Tubal Ligation  Other Topics Concern   Not on file  Social History Narrative   Not on file   Social Drivers of Health   Financial Resource Strain: Medium Risk (04/01/2023)   Overall Financial Resource Strain (CARDIA)    Difficulty of Paying Living Expenses: Somewhat hard  Food Insecurity: Food Insecurity Present (04/01/2023)   Hunger Vital Sign    Worried About Running Out of Food in the Last Year: Sometimes true    Ran  Out of Food in the Last Year: Sometimes true  Transportation Needs: No Transportation Needs (04/01/2023)   PRAPARE - Administrator, Civil Service (Medical): No    Lack of Transportation (Non-Medical): No  Physical Activity: Inactive (04/01/2023)   Exercise Vital Sign    Days of Exercise per Week: 0 days    Minutes of Exercise per Session: 10 min  Stress: No Stress Concern Present (04/01/2023)   [employer] of Occupational Health - Occupational Stress Questionnaire    Feeling  of Stress : Only a little  Social Connections: Moderately Integrated (04/01/2023)   Social Connection and Isolation Panel [NHANES]    Frequency of Communication with Friends and Family: Once a week    Frequency of Social Gatherings with Friends and Family: Once a week    Attends Religious Services: More than 4 times per year    Active Member of [employer] or Organizations: Yes    Attends Engineer, structural: More than 4 times per year    Marital Status: Married  Catering manager Violence: Not on file   Social History   Tobacco Use  Smoking Status Never  Smokeless Tobacco Never   Social History   Substance and Sexual Activity  Alcohol Use Yes   Comment: occassionally    Family History:  Family History  Problem Relation Age of Onset   Diabetes Mother    Heart disease Mother    Kidney disease Mother    Anxiety disorder Sister    Heart disease Maternal Grandmother    Breast cancer Neg Hx     Past medical history, surgical history, medications, allergies, family history and social history reviewed with patient today and changes made to appropriate areas of the chart.   Review of Systems  Eyes:  Negative for blurred vision and double vision.  Respiratory:  Negative for shortness of breath.   Cardiovascular:  Negative for chest pain, palpitations and leg swelling.  Genitourinary:        Abdominal cramping  Neurological:  Negative for dizziness and headaches.  Endo/Heme/Allergies:         Sensitive nipples   All other ROS negative except what is listed above and in the HPI.      Objective:    BP 123/77 (BP Location: Left Arm, Patient Position: Sitting, Cuff Size: Large)   Pulse 68   Temp 97.7 F (36.5 C) (Oral)   Resp 17   Ht 5' 1.26" (1.556 m)   Wt 214 lb 6.4 oz (97.3 kg)   LMP  (LMP Unknown)   SpO2 98%   BMI 40.17 kg/m   Wt Readings from Last 3 Encounters:  07/01/23 214 lb 6.4 oz (97.3 kg)  04/02/23 221 lb 6.4 oz (100.4 kg)  12/31/22 227 lb 9.6 oz (103.2 kg)    Physical Exam Vitals and nursing note reviewed.  Constitutional:      General: She is awake. She is not in acute distress.    Appearance: Normal appearance. She is well-developed. She is obese. She is not ill-appearing.  HENT:     Head: Normocephalic and atraumatic.     Right Ear: Hearing, tympanic membrane, ear canal and external ear normal. No drainage.     Left Ear: Hearing, tympanic membrane, ear canal and external ear normal. No drainage.     Nose: Nose normal.     Right Sinus: No maxillary sinus tenderness or frontal sinus tenderness.     Left Sinus: No maxillary sinus tenderness or frontal sinus tenderness.     Mouth/Throat:     Mouth: Mucous membranes are moist.     Pharynx: Oropharynx is clear. Uvula midline. No pharyngeal swelling, oropharyngeal exudate or posterior oropharyngeal erythema.  Eyes:     General: Lids are normal.        Right eye: No discharge.        Left eye: No discharge.     Extraocular Movements: Extraocular movements intact.     Conjunctiva/sclera: Conjunctivae normal.     Pupils: Pupils are  equal, round, and reactive to light.     Visual Fields: Right eye visual fields normal and left eye visual fields normal.  Neck:     Thyroid: No thyromegaly.     Vascular: No carotid bruit.     Trachea: Trachea normal.  Cardiovascular:     Rate and Rhythm: Normal rate and regular rhythm.     Heart sounds: Normal heart sounds. No murmur heard.    No gallop.  Pulmonary:      Effort: Pulmonary effort is normal. No accessory muscle usage or respiratory distress.     Breath sounds: Normal breath sounds.  Chest:  Breasts:    Right: Normal.     Left: Normal.  Abdominal:     General: Bowel sounds are normal.     Palpations: Abdomen is soft. There is no hepatomegaly or splenomegaly.     Tenderness: There is no abdominal tenderness.  Musculoskeletal:        General: Normal range of motion.     Cervical back: Normal range of motion and neck supple.     Right lower leg: No edema.     Left lower leg: No edema.  Lymphadenopathy:     Head:     Right side of head: No submental, submandibular, tonsillar, preauricular or posterior auricular adenopathy.     Left side of head: No submental, submandibular, tonsillar, preauricular or posterior auricular adenopathy.     Cervical: No cervical adenopathy.     Upper Body:     Right upper body: No supraclavicular, axillary or pectoral adenopathy.     Left upper body: No supraclavicular, axillary or pectoral adenopathy.  Skin:    General: Skin is warm and dry.     Capillary Refill: Capillary refill takes less than 2 seconds.     Findings: No rash.  Neurological:     Mental Status: She is alert and oriented to person, place, and time.     Gait: Gait is intact.  Psychiatric:        Attention and Perception: Attention normal.        Mood and Affect: Mood normal.        Speech: Speech normal.        Behavior: Behavior normal. Behavior is cooperative.        Thought Content: Thought content normal.        Judgment: Judgment normal.     Results for orders placed or performed in visit on 04/02/23  Comp Met (CMET)   Collection Time: 04/02/23 10:24 AM  Result Value Ref Range   Glucose 86 70 - 99 mg/dL   BUN 12 6 - 24 mg/dL   Creatinine, Ser 4.09 0.57 - 1.00 mg/dL   eGFR 811 >91 YN/WGN/5.62   BUN/Creatinine Ratio 17 9 - 23   Sodium 143 134 - 144 mmol/L   Potassium 4.3 3.5 - 5.2 mmol/L   Chloride 102 96 - 106 mmol/L    CO2 24 20 - 29 mmol/L   Calcium 9.6 8.7 - 10.2 mg/dL   Total Protein 6.8 6.0 - 8.5 g/dL   Albumin 4.5 3.9 - 4.9 g/dL   Globulin, Total 2.3 1.5 - 4.5 g/dL   Bilirubin Total 0.3 0.0 - 1.2 mg/dL   Alkaline Phosphatase 154 (H) 44 - 121 IU/L   AST 21 0 - 40 IU/L   ALT 37 (H) 0 - 32 IU/L  Lipid Profile   Collection Time: 04/02/23 10:24 AM  Result Value Ref Range   Cholesterol,  Total 160 100 - 199 mg/dL   Triglycerides 409 (H) 0 - 149 mg/dL   HDL 40 >81 mg/dL   VLDL Cholesterol Cal 35 5 - 40 mg/dL   LDL Chol Calc (NIH) 85 0 - 99 mg/dL   Chol/HDL Ratio 4.0 0.0 - 4.4 ratio  HgB A1c   Collection Time: 04/02/23 10:24 AM  Result Value Ref Range   Hgb A1c MFr Bld 6.3 (H) 4.8 - 5.6 %   Est. average glucose Bld gHb Est-mCnc 134 mg/dL      Assessment & Plan:   Problem List Items Addressed This Visit       Cardiovascular and Mediastinum   Hypertension   Chronic. Controlled in office today.  Continue with Lisinopril 40mg , Amlodipine 2.5mg  and hydrochlorothiazide 25mg .  Recommend checking blood pressure daily at home and keeping a log.  Bring log to next visit.  Follow up in 3 months.  Call sooner if concerns arise.       Relevant Medications   rosuvastatin (CRESTOR) 10 MG tablet     Endocrine   Controlled diabetes mellitus type 2 with complications (HCC)   Chronic.  Controlled.  Last A1c was 5.3%.  Was not able to get Ozempic due to cost- referral placed for pharmacy team to assist with cost of medication.  Will increase dose of Rosuvastatin to 10mg .  Continue with Lisinopril for Kidney protection.  Discussed eye exam.  Continue with current medication regimen.  Labs ordered today.  Return to clinic in 3 months for reevaluation.  Call sooner if concerns arise.        Relevant Medications   rosuvastatin (CRESTOR) 10 MG tablet   Other Relevant Orders   Hemoglobin A1c   AMB Referral [facility] Care Management   Diabetes mellitus treated with injections of non-insulin medication (HCC)    Relevant Medications   rosuvastatin (CRESTOR) 10 MG tablet     Other   Anxiety   Chronic.  Controlled with medication.  However, patient has been out.  Referral placed for pharmacy team to help with cost of medications.  Continue with Buspar15mg  daily.  Follow up in 3 months.  Call sooner if concerns arise.       Morbid obesity (HCC)   Recommended eating smaller high protein, low fat meals more frequently and exercising 30 mins a day 5 times a week with a goal of 10-15lb weight loss in the next 3 months. Patient has lost 7lbs since last visit!      Mixed hyperlipidemia   Relevant Medications   rosuvastatin (CRESTOR) 10 MG tablet   Other Relevant Orders   Lipid panel   Other Visit Diagnoses       Annual physical exam    -  Primary   Health maintenance reviewed during visit today.  Labs ordered.  Vaccines reviewed.  Colonoscopy, Mammogram and PAP up to date.   Relevant Orders   CBC with Differential/Platelet   Comprehensive metabolic panel with GFR   Lipid panel   TSH   Hemoglobin A1c        Follow up plan: Return in about 3 months (around 09/30/2023) for HTN, HLD, DM2 FU.   LABORATORY TESTING:  - Pap smear: up to date  IMMUNIZATIONS:   - Tdap: Tetanus vaccination status reviewed: last tetanus booster within 10 years. - Influenza: Postponed to flu season - Pneumovax: Not applicable - Prevnar: Not applicable - COVID: Not applicable - HPV: Not applicable - Shingrix vaccine: Not applicable  SCREENING: -Mammogram: Ordered today  -  Colonoscopy:  Cologuard ordered    - Bone Density: Not applicable  -Hearing Test: Not applicable  -Spirometry: Not applicable   PATIENT COUNSELING:   Advised to take 1 mg of folate supplement per day if capable of pregnancy.   Sexuality: Discussed sexually transmitted diseases, partner selection, use of condoms, avoidance of unintended pregnancy  and contraceptive alternatives.   Advised to avoid cigarette smoking.  I discussed with  the patient that most people either abstain from alcohol or drink within safe limits (<=14/week and <=4 drinks/occasion for males, <=7/weeks and <= 3 drinks/occasion for females) and that the risk for alcohol disorders and other health effects rises proportionally with the number of drinks per week and how often a drinker exceeds daily limits.  Discussed cessation/primary prevention of drug use and availability of treatment for abuse.   Diet: Encouraged to adjust caloric intake to maintain  or achieve ideal body weight, to reduce intake of dietary saturated fat and total fat, to limit sodium intake by avoiding high sodium foods and not adding table salt, and to maintain adequate dietary potassium and calcium preferably from fresh fruits, vegetables, and low-fat dairy products.    stressed the importance of regular exercise  Injury prevention: Discussed safety belts, safety helmets, smoke detector, smoking near bedding or upholstery.   Dental health: Discussed importance of regular tooth brushing, flossing, and dental visits.    NEXT PREVENTATIVE PHYSICAL DUE IN 1 YEAR. Return in about 3 months (around 09/30/2023) for HTN, HLD, DM2 FU.

## 2023-07-01 NOTE — Assessment & Plan Note (Signed)
 Recommended eating smaller high protein, low fat meals more frequently and exercising 30 mins a day 5 times a week with a goal of 10-15lb weight loss in the next 3 months. Patient has lost 7lbs since last visit!

## 2023-07-02 ENCOUNTER — Encounter: Payer: Self-pay | Admitting: Nurse Practitioner

## 2023-07-02 LAB — CBC WITH DIFFERENTIAL/PLATELET
Basophils Absolute: 0 10*3/uL (ref 0.0–0.2)
Basos: 1 %
EOS (ABSOLUTE): 0.2 10*3/uL (ref 0.0–0.4)
Eos: 3 %
Hematocrit: 39.2 % (ref 34.0–46.6)
Hemoglobin: 12.6 g/dL (ref 11.1–15.9)
Immature Grans (Abs): 0 10*3/uL (ref 0.0–0.1)
Immature Granulocytes: 0 %
Lymphocytes Absolute: 2.6 10*3/uL (ref 0.7–3.1)
Lymphs: 33 %
MCH: 29.4 pg (ref 26.6–33.0)
MCHC: 32.1 g/dL (ref 31.5–35.7)
MCV: 91 fL (ref 79–97)
Monocytes Absolute: 0.5 10*3/uL (ref 0.1–0.9)
Monocytes: 6 %
Neutrophils Absolute: 4.4 10*3/uL (ref 1.4–7.0)
Neutrophils: 57 %
Platelets: 312 10*3/uL (ref 150–450)
RBC: 4.29 x10E6/uL (ref 3.77–5.28)
RDW: 12.8 % (ref 11.7–15.4)
WBC: 7.8 10*3/uL (ref 3.4–10.8)

## 2023-07-02 LAB — COMPREHENSIVE METABOLIC PANEL WITH GFR
ALT: 18 IU/L (ref 0–32)
AST: 16 IU/L (ref 0–40)
Albumin: 4.3 g/dL (ref 3.9–4.9)
Alkaline Phosphatase: 116 IU/L (ref 44–121)
BUN/Creatinine Ratio: 17 (ref 9–23)
BUN: 11 mg/dL (ref 6–24)
Bilirubin Total: 0.3 mg/dL (ref 0.0–1.2)
CO2: 23 mmol/L (ref 20–29)
Calcium: 9.3 mg/dL (ref 8.7–10.2)
Chloride: 104 mmol/L (ref 96–106)
Creatinine, Ser: 0.66 mg/dL (ref 0.57–1.00)
Globulin, Total: 2.4 g/dL (ref 1.5–4.5)
Glucose: 90 mg/dL (ref 70–99)
Potassium: 3.9 mmol/L (ref 3.5–5.2)
Sodium: 143 mmol/L (ref 134–144)
Total Protein: 6.7 g/dL (ref 6.0–8.5)
eGFR: 107 mL/min/{1.73_m2} (ref >59)

## 2023-07-02 LAB — LIPID PANEL
Chol/HDL Ratio: 3.4 ratio (ref 0.0–4.4)
Cholesterol, Total: 147 mg/dL (ref 100–199)
HDL: 43 mg/dL (ref >39)
LDL Chol Calc (NIH): 82 mg/dL (ref 0–99)
Triglycerides: 121 mg/dL (ref 0–149)
VLDL Cholesterol Cal: 22 mg/dL (ref 5–40)

## 2023-07-02 LAB — HEMOGLOBIN A1C
Est. average glucose Bld gHb Est-mCnc: 134 mg/dL
Hgb A1c MFr Bld: 6.3 % — ABNORMAL HIGH (ref 4.8–5.6)

## 2023-07-02 LAB — TSH: TSH: 2.37 u[IU]/mL (ref 0.450–4.500)

## 2023-07-11 ENCOUNTER — Other Ambulatory Visit: Payer: Self-pay | Admitting: Nurse Practitioner

## 2023-07-12 ENCOUNTER — Telehealth: Payer: Self-pay

## 2023-07-12 NOTE — Telephone Encounter (Signed)
 Requested Prescriptions  Pending Prescriptions Disp Refills   hydrochlorothiazide  (HYDRODIURIL ) 25 MG tablet [Pharmacy Med Name: hydroCHLOROthiazide  25 MG Oral Tablet] 90 tablet 1    Sig: TAKE 1 TABLET BY MOUTH ONCE DAILY FOR HIGH BLOOD PRESSURE     Cardiovascular: Diuretics - Thiazide Passed - 07/12/2023  2:55 PM      Passed - Cr in normal range and within 180 days    Creatinine, Ser  Date Value Ref Range Status  07/01/2023 0.66 0.57 - 1.00 mg/dL Final         Passed - K in normal range and within 180 days    Potassium  Date Value Ref Range Status  07/01/2023 3.9 3.5 - 5.2 mmol/L Final         Passed - Na in normal range and within 180 days    Sodium  Date Value Ref Range Status  07/01/2023 143 134 - 144 mmol/L Final         Passed - Last BP in normal range    BP Readings from Last 1 Encounters:  07/01/23 123/77         Passed - Valid encounter within last 6 months    Recent Outpatient Visits           1 week ago Annual physical exam   [facility] Aileen Alexanders, NP

## 2023-07-12 NOTE — Progress Notes (Signed)
 Care Guide Pharmacy Note  07/12/2023 Name: Kari Brandt MRN: [id number] DOB: 23-Mar-1971  Referred By: Aileen Alexanders, NP Reason for referral: Complex Care Management (Outreach to schedule with pharm d )   Kari Brandt is a 53 y.o. year old female who is a primary care patient of Aileen Alexanders, NP.  Kari Brandt was referred to the pharmacist for assistance related to: DMII  Successful contact was made with the patient to discuss pharmacy services including being ready for the pharmacist to call at least 5 minutes before the scheduled appointment time and to have medication bottles and any blood pressure readings ready for review. The patient agreed to meet with the pharmacist via telephone visit on (date/time).07/23/2023  Lenton Rail , RMA     [place]  [facility] Guide  Direct Dial: [phone]  Website: [email or web address]

## 2023-07-19 ENCOUNTER — Other Ambulatory Visit: Payer: Self-pay | Admitting: Nurse Practitioner

## 2023-07-19 DIAGNOSIS — I10 Essential (primary) hypertension: Secondary | ICD-10-CM

## 2023-07-21 NOTE — Telephone Encounter (Signed)
 Requested Prescriptions  Pending Prescriptions Disp Refills   amLODipine  (NORVASC ) 2.5 MG tablet [Pharmacy Med Name: amLODIPine  Besylate 2.5 MG Oral Tablet] 90 tablet 0    Sig: Take 1 tablet by mouth once daily     Cardiovascular: Calcium  Channel Blockers 2 Passed - 07/21/2023  8:42 AM      Passed - Last BP in normal range    BP Readings from Last 1 Encounters:  07/01/23 123/77         Passed - Last Heart Rate in normal range    Pulse Readings from Last 1 Encounters:  07/01/23 68         Passed - Valid encounter within last 6 months    Recent Outpatient Visits           2 weeks ago Annual physical exam   [place] [facility] Aileen Alexanders, NP

## 2023-07-23 ENCOUNTER — Telehealth: Payer: Self-pay

## 2023-07-23 ENCOUNTER — Other Ambulatory Visit: Payer: Self-pay

## 2023-07-23 ENCOUNTER — Other Ambulatory Visit (HOSPITAL_COMMUNITY): Payer: Self-pay

## 2023-07-23 NOTE — Addendum Note (Signed)
 Addended by: Keane Passe A on: 07/23/2023 03:45 PM   Modules accepted: Orders

## 2023-07-23 NOTE — Progress Notes (Signed)
 Good morning, I am happy to help out with this one, however we have not officially gone live with [facility] yet so we may not be able to assist on future requests until our "go live date". I do see that PA was previously approved in January for Ozempic  3 ml pen, however it looks as though the 1.5 ml pen was prescribed. Per test claim, 3 ml pen would be covered for $40.00 without manufacturer coupon and $24.99 with coupon, if pt is eligible. Hope this helps, thanks!

## 2023-07-23 NOTE — Progress Notes (Signed)
 07/23/2023 Name: Kari Brandt MRN: [id number] DOB: [birth year]  Chief Complaint  Patient presents with   Diabetes Management Plan   Kari Brandt is a 53 y.o. year old female who presented for a telephone visit.   They were referred to the pharmacist by their PCP for assistance in managing medication access.   Subjective:  Care Team: Primary Care Provider: Aileen Alexanders, NP ; Next Scheduled Visit: 7/14   Medication Access/Adherence  Current Pharmacy:  [facility] [address] ([address]), [place] - [address] [place] ([place]) [place] [address] Phone: [phone] Fax: [phone]  -Patient reports affordability concerns with their medications: Yes  -Patient reports access/transportation concerns to their pharmacy: No  -Patient reports adherence concerns with their medications:  No    Diabetes: Current medications: metformin  500mg  BID -Recently prescribed Ozempic  0.25/0.5mg  for renal protection and weight loss beneift but has not been able to start medication due to cost -Patient endorses tolerating metformin  500mg  BID with no adverse side effects -A1c 6.3% -Patient does not monitor home BG -Patient denies hypoglycemic s/sx including dizziness, shakiness, sweating.  -Patient denies hyperglycemic symptoms including polyuria, polydipsia, polyphagia, nocturia, neuropathy, blurred vision.  Objective:  Lab Results  Component Value Date   HGBA1C 6.3 (H) 07/01/2023   Lab Results  Component Value Date   CREATININE 0.66 07/01/2023   BUN 11 07/01/2023   NA 143 07/01/2023   K 3.9 07/01/2023   CL 104 07/01/2023   CO2 23 07/01/2023   Medications Reviewed Today     Reviewed by Linn Rich, RPH (Pharmacist) on 07/23/23 at 0913  Med List Status: <None>   Medication Order Taking? Sig Documenting Provider Last Dose Status Informant  albuterol  (VENTOLIN  HFA) 108 (90 Base) MCG/ACT inhaler [id number]  Inhale 1-2 puffs into  the lungs every 6 (six) hours as needed. Nellie Banas M, PA-C  Active   amLODipine  (NORVASC ) 2.5 MG tablet [id number]  Take 1 tablet by mouth once daily Aileen Alexanders, NP  Active   Ascorbic Acid (VITAMIN C) 1000 MG tablet [id number]  Take 1,000 mg by mouth daily. [provider]  Active Self  ASPIRIN  81 PO [id number]  Take 1 tablet by mouth daily. [provider]  Active Self  busPIRone  (BUSPAR ) 15 MG tablet [id number]  Take 1 tablet (15 mg total) by mouth 2 (two) times daily. Aileen Alexanders, NP  Active   hydrochlorothiazide  (HYDRODIURIL ) 25 MG tablet [id number]  TAKE 1 TABLET BY MOUTH ONCE DAILY FOR HIGH BLOOD PRESSURE Aileen Alexanders, NP  Active   hydrocortisone  (PROCTO-MED HC ) 2.5 % rectal cream [id number]  Place 1 Application rectally 2 (two) times daily. Aileen Alexanders, NP  Active   lisinopril  (ZESTRIL ) 40 MG tablet [id number]  Take 1 tablet by mouth once daily Aileen Alexanders, NP  Active   loratadine (CLARITIN) 10 MG tablet [id number]  Take 10 mg by mouth daily. [provider]  Active   metFORMIN  (GLUCOPHAGE ) 500 MG tablet [id number] Yes TAKE 1 TABLET BY MOUTH TWICE DAILY WITH A MEAL Aileen Alexanders, NP Taking Active   rosuvastatin  (CRESTOR ) 10 MG tablet [id number]  Take 1 tablet (10 mg total) by mouth daily. Aileen Alexanders, NP  Active   Semaglutide ,0.25 or 0.5MG /DOS, (OZEMPIC , 0.25 OR 0.5 MG/DOSE,) 2 MG/1.5ML SOPN [id number] No Inject 0.25 mg into the skin once a week. Start with 0.25MG  once a week x 4 weeks, then increase to 0.5MG  weekly.  Patient not taking: Reported on 07/23/2023   Aileen Alexanders, NP Not  Taking Active   triamcinolone  cream (KENALOG ) 0.1 % [id number]  Apply 1 Application topically 2 (two) times daily. Aileen Alexanders, NP  Active   VITAMIN D  PO [id number]  Take 500 mg by mouth daily. [provider]  Active Self           Assessment/Plan:   Diabetes: -Currently controlled -Contacted insurance to verify coverage and  check copay; the representative states Ozempic  does require a prior authorization, but once approved copay should be $40.  Manufacturer copay card likely to bring down to $25.  Routing to PA team to initiate prior authorization, and sending patient a message to make her aware and provide copay card information to give to pharmacy once PA is approved.  Follow Up Plan: Will reach out upon return from [facility] th week of 5/12 to verify patient able to receive medication and will schedule a 4 week follow-up  Linn Rich, PharmD, DPLA

## 2023-07-23 NOTE — Telephone Encounter (Signed)
 Pharmacy Patient Advocate Encounter   Received notification from  [facility] request  that prior authorization for Ozempic  is required/requested.   Insurance verification completed.   The patient is insured through  [employer]  .   Per test claim:  3 ml pen (1.34ml was sent in) is preferred by the insurance.  If suggested medication is appropriate, Please send in a new RX and discontinue this one. If not, please advise as to why it's not appropriate so that we may request a Prior Authorization. Please note, some preferred medications may still require a PA.  If the suggested medications have not been trialed and there are no contraindications to their use, the PA will not be submitted, as it will not be approved.    Per test claim: The current 28 day co-pay is, $40.00.  No PA needed at this time. This test claim was processed through [facility]- copay amounts may vary at other pharmacies due to pharmacy/plan contracts, or as the patient moves through the different stages of their insurance plan.

## 2023-07-26 ENCOUNTER — Ambulatory Visit: Payer: Self-pay

## 2023-07-26 MED ORDER — OZEMPIC (0.25 OR 0.5 MG/DOSE) 2 MG/3ML ~~LOC~~ SOPN: PEN_INJECTOR | SUBCUTANEOUS | 1 refills | Status: DC

## 2023-07-26 NOTE — Addendum Note (Signed)
 Addended by: Aileen Alexanders on: 07/26/2023 07:55 AM   Modules accepted: Orders

## 2023-07-26 NOTE — Telephone Encounter (Signed)
 Correction provided no further needs.

## 2023-07-26 NOTE — Telephone Encounter (Signed)
 Copied from CRM [id number]. Topic: Clinical - Prescription Issue >> Jul 26, 2023  9:42 AM Jayson Michael wrote: Reason for CRM: [facility]- did not give name calling to get clarification on Semaglutide ,0.25 or 0.5MG /DOS, (OZEMPIC , 0.25 OR 0.5 MG/DOSE,) 2 MG/3ML SOPN  Current directions stated : Inject 0.25mg  into the skin weekly for 4 weeks, then increase to 0.25mg  They want to clarify if that is suppose to reflect an increase to .5mg  instead  Nurse Triage E2C2 Note Pharmacy calling for clarification on what appears to be a typo in prescription directions for pt's Semaglutide ,0.25 or 0.5MG /DOS, (OZEMPIC , 0.25 OR 0.5 MG/DOSE,) 2 MG/3ML SOPN. Pharmacy wants to clarify if supposed to increase to 0.5 mg rather than "increase to 0.25 mg" from 0.25 mg as stated in directions. Advised per CAL that sending HP message to office for call back to pharmacy. Please call back at [phone] to speak to Anton at [facility] on [address] in [place].  Reason for Disposition  [1] Pharmacy calling with prescription question AND [2] triager unable to answer question  Answer Assessment - Initial Assessment Questions 1. REASON FOR CALL or QUESTION: "What is your reason for calling today?" or "How can I best help you?" or "What question do you have that I can help answer?"     Wanting to make sure directions are supposed to say "increase to 0.5 mg" instead of "Inject 0.25mg  into the skin weekly for 4 weeks, then increase to 0.25mg " as currently stated  Answer Assessment - Initial Assessment Questions 1. NAME of MEDICINE: "What medicine(s) are you calling about?"     Semaglutide ,0.25 or 0.5MG /DOS, (OZEMPIC , 0.25 OR 0.5 MG/DOSE,) 2 MG/3ML SOPN 2. QUESTION: "What is your question?" (e.g., double dose of medicine, side effect)     Looks like typo in directions 3. PRESCRIBER: "Who prescribed the medicine?" Reason: if prescribed by specialist, call should be referred to that group.     [place] NP  Protocols  used: Information Only Call - No Triage-A-AH, Medication Question Call-A-AH

## 2023-08-09 ENCOUNTER — Telehealth: Payer: Self-pay

## 2023-08-09 NOTE — Progress Notes (Signed)
   08/09/2023  Patient ID: Kari Brandt, female   DOB: 1970-09-08, 53 y.o.   MRN: [id number]  Subjective/Objective Telephone visit to follow-up on management of diabetes  Diabetes Management -Current medications:  Ozempic  0.25mg  weekly, metformin  500mg  BID -Patient endorses forgetting second dose of metformin  500mg  on occasion -Took 1st dose of Ozempic  0.25mg  last Tuesday and is due for second dose tomorrow.  Endorses feeling of fullness that led to vomitting night of the injection and morning after. -Patient endorses an instance of anxiety attack lasting 3 hours but is also suspicious of low blood sugar due to not eating much prior to the event. -Does not monitor home BG -Last A1c 6.3%  Assessment/Plan  Diabetes Management -Continue current regimen at this time -Test claim reflects test strips would be $45 copay on insurance, so I suggested patient purchase [employer] brand glucometer and testing supplies OTC at [facility].  Recommend checking BG if experiencing s/sx of anxiety attack, as these can also be similar to s/sx of hypoglycemia. -Recommend eating smaller portions more frequently to prevent feeling too full leading to n/v.  Provided suggestions of snacks to provide protein, fiber, vitamins/minerals to adequate nutrition.  Follow-up:  3 weeks  Linn Rich, PharmD, DPLA

## 2023-08-31 ENCOUNTER — Other Ambulatory Visit: Payer: Self-pay

## 2023-09-03 ENCOUNTER — Other Ambulatory Visit: Payer: Self-pay

## 2023-09-03 NOTE — Progress Notes (Unsigned)
   09/03/2023  Patient ID: Kari Brandt, female   DOB: 10-29-1970, 53 y.o.   MRN: [id number]  Subjective/Objective Telephone visit to follow-up on management of diabetes   Diabetes Management -Current medications:  Ozempic  0.25mg  weekly, metformin  500mg  BID -Patient endorses forgetting second dose of metformin  500mg  on occasion -Patient has been taking Ozempic  0.25mg  weekly four weeks now and endorses GI side effects have subsided.  She takes her dose on Tuesdays and endorses having panic attacks the following Thursday.   -Does not monitor home BG, but I have educated her on lower cost OTC testing supplies that can be purchased at [facility], [facility], or through [employer] -Last A1c 6.3%  Hypertension -Current medications:  amlodipine  2.5mg  daily, hydrochlorothiazide  25mg  daily, lisniopril 36m daily -BP well controlled with last OV reading of 123/77 -Requesting a refill on lisinopril  40mg  daily  Panic Attacks -Current medications:  buspirone  15mg  BID -Patient states this has been an ongoing issue for her and does not seem to have worsened or increased since starting Ozempic , but she does notice attacks are consistently on Thursdays each week after taking her Ozempic  injection on Tuesdays -Patient states she has not tried any other pharmacotherapy for panic attacks in the past   Assessment/Plan   Diabetes Management -Recommend increasing Ozempic  to 0.5mg  weekly, continue metformin  500mg  BID -Obtain home diabetic testing supplies if affordable and begin checking FBG regularly -Sees PCP again in 1 month and is due for A1c; could consider decreasing or stopping metformin  to decrease pill burden based on A1c  Hypertension -Continue current regimen and regular follow-up with PCP -Refill pending for lisinopril  40mg  daily for PCP to sign if in agreement  Panic Attacks -Recommend discussion and consideration of additional pharmacotherapy to help prevent panic attacks, such as SSRI  (citalopram, escitalopram, fluoxetine, sertraine, paroxetine).  Would start with low dose and increase after 4-6 weeks if needed.  Discuss potential length to onset of action and potential side effects of therapy.   Follow-up:  3 months   Linn Rich, PharmD, DPLA

## 2023-09-06 MED ORDER — LISINOPRIL 40 MG PO TABS: 40.0000 mg | ORAL_TABLET | Freq: Every day | ORAL | 1 refills | Status: DC

## 2023-10-04 ENCOUNTER — Ambulatory Visit: Admitting: Nurse Practitioner

## 2023-10-04 ENCOUNTER — Encounter: Payer: Self-pay | Admitting: Nurse Practitioner

## 2023-10-04 DIAGNOSIS — I10 Essential (primary) hypertension: Secondary | ICD-10-CM

## 2023-10-04 DIAGNOSIS — Z7985 Long-term (current) use of injectable non-insulin antidiabetic drugs: Secondary | ICD-10-CM | POA: Diagnosis not present

## 2023-10-04 DIAGNOSIS — E119 Type 2 diabetes mellitus without complications: Secondary | ICD-10-CM

## 2023-10-04 DIAGNOSIS — F419 Anxiety disorder, unspecified: Secondary | ICD-10-CM

## 2023-10-04 DIAGNOSIS — K5900 Constipation, unspecified: Secondary | ICD-10-CM

## 2023-10-04 DIAGNOSIS — E782 Mixed hyperlipidemia: Secondary | ICD-10-CM | POA: Diagnosis not present

## 2023-10-04 DIAGNOSIS — E118 Type 2 diabetes mellitus with unspecified complications: Secondary | ICD-10-CM | POA: Diagnosis not present

## 2023-10-04 NOTE — Assessment & Plan Note (Signed)
 Chronic.  Ongoing concern.  Recommend continuing with Buspar  in the morning.  Okay to take medication BID.

## 2023-10-04 NOTE — Assessment & Plan Note (Signed)
 Chronic.  Controlled.  Last A1c was 6.3%.  Taking Ozempic - has lost 20lbs since last July.    Continue with Lisinopril  for Kidney protection.  Discussed eye exam.  Continue with current medication regimen.  Labs ordered today.  Return to clinic in 3 months for reevaluation.  Call sooner if concerns arise.

## 2023-10-04 NOTE — Assessment & Plan Note (Signed)
Chronic. Controlled in office today.  Continue with Lisinopril 40mg , Amlodipine 2.5mg  and hydrochlorothiazide 25mg .  Recommend checking blood pressure daily at home and keeping a log.  Bring log to next visit.  Follow up in 3 months.  Call sooner if concerns arise.

## 2023-10-04 NOTE — Assessment & Plan Note (Signed)
 Chronic.  Recommend restarting Rosuvastatin .  Would benefit from increasing dose in the future.  Labs ordered.  Vaccines up to date.

## 2023-10-04 NOTE — Progress Notes (Signed)
 BP 107/73   Pulse 71   Temp 98.6 F (37 C) (Oral)   Wt 210 lb (95.3 kg)   LMP 09/12/2023 (Approximate)   SpO2 98%   BMI 39.34 kg/m    Subjective:    Patient ID: Kari Brandt, female    DOB: 1970-05-25, 53 y.o.   MRN: [id number]  HPI: Kari Brandt is a 53 y.o. female  Chief Complaint  Patient presents with   Constipation    Pt states she believe its one of her medications so she stopped taking Crestor  a week ago to see if that helped and it has not     HYPERTENSION / HYPERLIPIDEMIA Satisfied with current treatment? yes Duration of hypertension: years BP monitoring frequency: not checking BP range:  BP medication side effects: no Past BP meds: lisinopril  Duration of hyperlipidemia: years Cholesterol medication side effects: no Cholesterol supplements: none Past cholesterol medications: none Medication compliance: excellent compliance Aspirin : no Recent stressors: no Recurrent headaches: no Visual changes: no Palpitations: no Dyspnea: no Chest pain: no Lower extremity edema: no Dizzy/lightheaded: no  DIABETES Metformin  500mg  daily.  Hypoglycemic episodes:no Polydipsia/polyuria: no Visual disturbance: no Chest pain: no Paresthesias: no Glucose Monitoring: no  Accucheck frequency: Not Checking  Fasting glucose:  Post prandial:  Evening:  Before meals: Taking Insulin?: no  Long acting insulin:  Short acting insulin: Blood Pressure Monitoring: weekly Retinal Examination: Will schedule with [facility] Foot Exam: Up to Date Diabetic Education: Not Completed Pneumovax: Up to Date Influenza: Up to Date Aspirin : no  CONSTIPATION Patient states she is having trouble with constipation.  She was having the issues before taking the Ozempic .  Stopped both Ozempic  and rosuvastatin  and still having issues.  She is taking two stool softeners.    ANXIETY Patient states her anxiety has been having some panic attacks.  Has them on Thursday's sometimes.  She  started taking the Buspar  in the morning instead of at night and that seems to be helping.     Relevant past medical, surgical, family and social history reviewed and updated as indicated. Interim medical history since our last visit reviewed. Allergies and medications reviewed and updated.  Review of Systems  Eyes:  Negative for visual disturbance.  Respiratory:  Negative for cough, chest tightness and shortness of breath.   Cardiovascular:  Negative for chest pain, palpitations and leg swelling.  Gastrointestinal:  Positive for constipation.  Endocrine: Negative for polydipsia and polyuria.  Neurological:  Negative for dizziness, numbness and headaches.  Psychiatric/Behavioral:  The patient is nervous/anxious.     Per HPI unless specifically indicated above     Objective:    BP 107/73   Pulse 71   Temp 98.6 F (37 C) (Oral)   Wt 210 lb (95.3 kg)   LMP 09/12/2023 (Approximate)   SpO2 98%   BMI 39.34 kg/m   Wt Readings from Last 3 Encounters:  10/04/23 210 lb (95.3 kg)  07/01/23 214 lb 6.4 oz (97.3 kg)  04/02/23 221 lb 6.4 oz (100.4 kg)    Physical Exam Vitals and nursing note reviewed.  Constitutional:      General: She is not in acute distress.    Appearance: Normal appearance. She is normal weight. She is not ill-appearing, toxic-appearing or diaphoretic.  HENT:     Head: Normocephalic.     Right Ear: External ear normal.     Left Ear: External ear normal.     Nose: Nose normal.     Mouth/Throat:  Mouth: Mucous membranes are moist.     Pharynx: Oropharynx is clear.  Eyes:     General:        Right eye: No discharge.        Left eye: No discharge.     Extraocular Movements: Extraocular movements intact.     Conjunctiva/sclera: Conjunctivae normal.     Pupils: Pupils are equal, round, and reactive to light.  Cardiovascular:     Rate and Rhythm: Normal rate and regular rhythm.     Heart sounds: No murmur heard. Pulmonary:     Effort: Pulmonary effort is  normal. No respiratory distress.     Breath sounds: Normal breath sounds. No wheezing or rales.  Musculoskeletal:     Cervical back: Normal range of motion and neck supple.  Skin:    General: Skin is warm and dry.     Capillary Refill: Capillary refill takes less than 2 seconds.  Neurological:     General: No focal deficit present.     Mental Status: She is alert and oriented to person, place, and time. Mental status is at baseline.  Psychiatric:        Mood and Affect: Mood normal.        Behavior: Behavior normal.        Thought Content: Thought content normal.        Judgment: Judgment normal.     Results for orders placed or performed in visit on 07/01/23  CBC with Differential/Platelet   Collection Time: 07/01/23  8:52 AM  Result Value Ref Range   WBC 7.8 3.4 - 10.8 x10E3/uL   RBC 4.29 3.77 - 5.28 x10E6/uL   Hemoglobin 12.6 11.1 - 15.9 g/dL   Hematocrit 60.7 65.9 - 46.6 %   MCV 91 79 - 97 fL   MCH 29.4 26.6 - 33.0 pg   MCHC 32.1 31.5 - 35.7 g/dL   RDW 87.1 88.2 - 84.5 %   Platelets 312 150 - 450 x10E3/uL   Neutrophils 57 Not Estab. %   Lymphs 33 Not Estab. %   Monocytes 6 Not Estab. %   Eos 3 Not Estab. %   Basos 1 Not Estab. %   Neutrophils Absolute 4.4 1.4 - 7.0 x10E3/uL   Lymphocytes Absolute 2.6 0.7 - 3.1 x10E3/uL   Monocytes Absolute 0.5 0.1 - 0.9 x10E3/uL   EOS (ABSOLUTE) 0.2 0.0 - 0.4 x10E3/uL   Basophils Absolute 0.0 0.0 - 0.2 x10E3/uL   Immature Granulocytes 0 Not Estab. %   Immature Grans (Abs) 0.0 0.0 - 0.1 x10E3/uL  Comprehensive metabolic panel with GFR   Collection Time: 07/01/23  8:52 AM  Result Value Ref Range   Glucose 90 70 - 99 mg/dL   BUN 11 6 - 24 mg/dL   Creatinine, Ser 9.33 0.57 - 1.00 mg/dL   eGFR 892 >40 fO/fpw/8.26   BUN/Creatinine Ratio 17 9 - 23   Sodium 143 134 - 144 mmol/L   Potassium 3.9 3.5 - 5.2 mmol/L   Chloride 104 96 - 106 mmol/L   CO2 23 20 - 29 mmol/L   Calcium  9.3 8.7 - 10.2 mg/dL   Total Protein 6.7 6.0 - 8.5 g/dL    Albumin 4.3 3.9 - 4.9 g/dL   Globulin, Total 2.4 1.5 - 4.5 g/dL   Bilirubin Total 0.3 0.0 - 1.2 mg/dL   Alkaline Phosphatase 116 44 - 121 IU/L   AST 16 0 - 40 IU/L   ALT 18 0 - 32 IU/L  Lipid  panel   Collection Time: 07/01/23  8:52 AM  Result Value Ref Range   Cholesterol, Total 147 100 - 199 mg/dL   Triglycerides 878 0 - 149 mg/dL   HDL 43 >60 mg/dL   VLDL Cholesterol Cal 22 5 - 40 mg/dL   LDL Chol Calc (NIH) 82 0 - 99 mg/dL   Chol/HDL Ratio 3.4 0.0 - 4.4 ratio  TSH   Collection Time: 07/01/23  8:52 AM  Result Value Ref Range   TSH 2.370 0.450 - 4.500 uIU/mL  Hemoglobin A1c   Collection Time: 07/01/23  8:52 AM  Result Value Ref Range   Hgb A1c MFr Bld 6.3 (H) 4.8 - 5.6 %   Est. average glucose Bld gHb Est-mCnc 134 mg/dL      Assessment & Plan:   Problem List Items Addressed This Visit       Cardiovascular and Mediastinum   Hypertension   Chronic. Controlled in office today.  Continue with Lisinopril  40mg , Amlodipine  2.5mg  and hydrochlorothiazide  25mg .  Recommend checking blood pressure daily at home and keeping a log.  Bring log to next visit.  Follow up in 3 months.  Call sooner if concerns arise.         Endocrine   Controlled diabetes mellitus type 2 with complications (HCC)   Chronic.  Controlled.  Last A1c was 6.3%.  Taking Ozempic - has lost 20lbs since last July.    Continue with Lisinopril  for Kidney protection.  Discussed eye exam.  Continue with current medication regimen.  Labs ordered today.  Return to clinic in 3 months for reevaluation.  Call sooner if concerns arise.       Relevant Orders   Hemoglobin A1c   Diabetes mellitus treated with injections of non-insulin medication (HCC)   Relevant Orders   Comprehensive metabolic panel with GFR     Other   Anxiety   Chronic.  Ongoing concern.  Recommend continuing with Buspar  in the morning.  Okay to take medication BID.        Morbid obesity (HCC) - Primary   Recommended eating smaller high protein, low  fat meals more frequently and exercising 30 mins a day 5 times a week with a goal of 10-15lb weight loss in the next 3 months. Has lost 22lbs since last visit.        Mixed hyperlipidemia   Chronic.  Recommend restarting Rosuvastatin .  Would benefit from increasing dose in the future.  Labs ordered.  Vaccines up to date.      Relevant Orders   Lipid panel   Other Visit Diagnoses       Constipation, unspecified constipation type       Recommend miralax BID for 2 weeks.  Continue with colace.  After two weeks, continue miralax as needed to help with bowel movements.        Follow up plan: Return in about 3 months (around 01/04/2024) for HTN, HLD, DM2 FU.

## 2023-10-04 NOTE — Assessment & Plan Note (Addendum)
 Recommended eating smaller high protein, low fat meals more frequently and exercising 30 mins a day 5 times a week with a goal of 10-15lb weight loss in the next 3 months. Has lost 22lbs since last visit.

## 2023-10-05 ENCOUNTER — Ambulatory Visit: Payer: Self-pay | Admitting: Nurse Practitioner

## 2023-10-05 LAB — LIPID PANEL
Chol/HDL Ratio: 4.2 ratio (ref 0.0–4.4)
Cholesterol, Total: 177 mg/dL (ref 100–199)
HDL: 42 mg/dL (ref >39)
LDL Chol Calc (NIH): 105 mg/dL — ABNORMAL HIGH (ref 0–99)
Triglycerides: 174 mg/dL — ABNORMAL HIGH (ref 0–149)
VLDL Cholesterol Cal: 30 mg/dL (ref 5–40)

## 2023-10-05 LAB — COMPREHENSIVE METABOLIC PANEL WITH GFR
ALT: 24 IU/L (ref 0–32)
AST: 20 IU/L (ref 0–40)
Albumin: 4.6 g/dL (ref 3.9–4.9)
Alkaline Phosphatase: 143 IU/L — ABNORMAL HIGH (ref 44–121)
BUN/Creatinine Ratio: 18 (ref 9–23)
BUN: 13 mg/dL (ref 6–24)
Bilirubin Total: 0.4 mg/dL (ref 0.0–1.2)
CO2: 21 mmol/L (ref 20–29)
Calcium: 9.3 mg/dL (ref 8.7–10.2)
Chloride: 104 mmol/L (ref 96–106)
Creatinine, Ser: 0.74 mg/dL (ref 0.57–1.00)
Globulin, Total: 2.3 g/dL (ref 1.5–4.5)
Glucose: 97 mg/dL (ref 70–99)
Potassium: 4.5 mmol/L (ref 3.5–5.2)
Sodium: 141 mmol/L (ref 134–144)
Total Protein: 6.9 g/dL (ref 6.0–8.5)
eGFR: 99 mL/min/1.73 (ref >59)

## 2023-10-05 LAB — HEMOGLOBIN A1C
Est. average glucose Bld gHb Est-mCnc: 117 mg/dL
Hgb A1c MFr Bld: 5.7 % — ABNORMAL HIGH (ref 4.8–5.6)

## 2023-10-06 ENCOUNTER — Other Ambulatory Visit: Payer: Self-pay | Admitting: Nurse Practitioner

## 2023-10-07 NOTE — Telephone Encounter (Signed)
 Requested Prescriptions  Pending Prescriptions Disp Refills   metFORMIN  (GLUCOPHAGE ) 500 MG tablet [Pharmacy Med Name: metFORMIN  HCl 500 MG Oral Tablet] 180 tablet 0    Sig: TAKE 1 TABLET BY MOUTH TWICE DAILY WITH A MEAL     Endocrinology:  Diabetes - Biguanides Failed - 10/07/2023 12:22 PM      Failed - B12 Level in normal range and within 720 days    Vitamin B-12  Date Value Ref Range Status  12/04/2020 338 232 - 1,245 pg/mL Final         Passed - Cr in normal range and within 360 days    Creatinine, Ser  Date Value Ref Range Status  10/04/2023 0.74 0.57 - 1.00 mg/dL Final         Passed - HBA1C is between 0 and 7.9 and within 180 days    HB A1C (BAYER DCA - WAIVED)  Date Value Ref Range Status  10/26/2022 6.5 (H) 4.8 - 5.6 % Final    Comment:             Prediabetes: 5.7 - 6.4          Diabetes: >6.4          Glycemic control for adults with diabetes: <7.0    Hgb A1c MFr Bld  Date Value Ref Range Status  10/04/2023 5.7 (H) 4.8 - 5.6 % Final    Comment:             Prediabetes: 5.7 - 6.4          Diabetes: >6.4          Glycemic control for adults with diabetes: <7.0          Passed - eGFR in normal range and within 360 days    GFR calc Af Amer  Date Value Ref Range Status  03/06/2020 85 >59 mL/min/1.73 Final    Comment:    **In accordance with recommendations from the NKF-ASN Task force,**   [facility] is in the process of updating its eGFR calculation to the   2021 CKD-EPI creatinine equation that estimates kidney function   without a race variable.    GFR, Estimated  Date Value Ref Range Status  10/14/2020 >60 >60 mL/min Final    Comment:    (NOTE) Calculated using the CKD-EPI Creatinine Equation (2021)    eGFR  Date Value Ref Range Status  10/04/2023 99 >59 mL/min/1.73 Final         Passed - Valid encounter within last 6 months    Recent Outpatient Visits           3 days ago Morbid obesity (HCC)   [facility] Melvin Pao, NP   3 months ago Annual physical exam   [place] [facility] Melvin Pao, NP              Passed - CBC within normal limits and completed in the last 12 months    WBC  Date Value Ref Range Status  07/01/2023 7.8 3.4 - 10.8 x10E3/uL Final  10/14/2020 6.0 4.0 - 10.5 K/uL Final   RBC  Date Value Ref Range Status  07/01/2023 4.29 3.77 - 5.28 x10E6/uL Final  10/14/2020 4.33 3.87 - 5.11 MIL/uL Final   Hemoglobin  Date Value Ref Range Status  07/01/2023 12.6 11.1 - 15.9 g/dL Final   Hematocrit  Date Value Ref Range Status  07/01/2023 39.2 34.0 - 46.6 % Final   MCHC  Date Value Ref Range Status  07/01/2023 32.1 31.5 - 35.7 g/dL Final  92/74/7977 66.4 30.0 - 36.0 g/dL Final   [facility]  Date Value Ref Range Status  07/01/2023 29.4 26.6 - 33.0 pg Final  10/14/2020 29.6 26.0 - 34.0 pg Final   MCV  Date Value Ref Range Status  07/01/2023 91 79 - 97 fL Final   No results found for: PLTCOUNTKUC, LABPLAT, POCPLA RDW  Date Value Ref Range Status  07/01/2023 12.8 11.7 - 15.4 % Final         Refused Prescriptions Disp Refills   rosuvastatin  (CRESTOR ) 5 MG tablet [Pharmacy Med Name: Rosuvastatin  Calcium  5 MG Oral Tablet] 90 tablet 0    Sig: Take 1 tablet by mouth once daily     Cardiovascular:  Antilipid - Statins 2 Failed - 10/07/2023 12:22 PM      Failed - Lipid Panel in normal range within the last 12 months    Cholesterol, Total  Date Value Ref Range Status  10/04/2023 177 100 - 199 mg/dL Final   LDL Chol Calc (NIH)  Date Value Ref Range Status  10/04/2023 105 (H) 0 - 99 mg/dL Final   HDL  Date Value Ref Range Status  10/04/2023 42 >39 mg/dL Final   Triglycerides  Date Value Ref Range Status  10/04/2023 174 (H) 0 - 149 mg/dL Final         Passed - Cr in normal range and within 360 days    Creatinine, Ser  Date Value Ref Range Status  10/04/2023 0.74 0.57 - 1.00 mg/dL Final         Passed - Patient is not pregnant       Passed - Valid encounter within last 12 months    Recent Outpatient Visits           3 days ago Morbid obesity (HCC)   [place] [facility] Melvin Pao, NP   3 months ago Annual physical exam   [place] [facility] Melvin Pao, NP

## 2023-10-15 ENCOUNTER — Other Ambulatory Visit: Payer: Self-pay | Admitting: Nurse Practitioner

## 2023-10-15 DIAGNOSIS — I10 Essential (primary) hypertension: Secondary | ICD-10-CM

## 2023-10-18 NOTE — Telephone Encounter (Signed)
 Requested Prescriptions  Pending Prescriptions Disp Refills   amLODipine  (NORVASC ) 2.5 MG tablet [Pharmacy Med Name: amLODIPine  Besylate 2.5 MG Oral Tablet] 90 tablet 0    Sig: Take 1 tablet by mouth once daily     Cardiovascular: Calcium  Channel Blockers 2 Passed - 10/18/2023  7:54 AM      Passed - Last BP in normal range    BP Readings from Last 1 Encounters:  10/04/23 107/73         Passed - Last Heart Rate in normal range    Pulse Readings from Last 1 Encounters:  10/04/23 71         Passed - Valid encounter within last 6 months    Recent Outpatient Visits           2 weeks ago Morbid obesity (HCC)   [place] [facility] Melvin Pao, NP   3 months ago Annual physical exam   [place] [facility] Melvin Pao, NP

## 2023-11-16 ENCOUNTER — Other Ambulatory Visit: Payer: Self-pay | Admitting: Nurse Practitioner

## 2023-11-18 NOTE — Telephone Encounter (Signed)
 Requested medication (s) are due for refill today:   Not sure   dose clarification needed  Requested medication (s) are on the active medication list:   Yes  Future visit scheduled:   No.    Seen a mo. ago   Last ordered: Refill request for 5 mg but it looks like pt is on 10 mg.   Dose clarification needed.   Requested Prescriptions  Pending Prescriptions Disp Refills   rosuvastatin  (CRESTOR ) 5 MG tablet [Pharmacy Med Name: Rosuvastatin  Calcium  5 MG Oral Tablet] 90 tablet 0    Sig: Take 1 tablet by mouth once daily     Cardiovascular:  Antilipid - Statins 2 Failed - 11/18/2023  1:25 PM      Failed - Lipid Panel in normal range within the last 12 months    Cholesterol, Total  Date Value Ref Range Status  10/04/2023 177 100 - 199 mg/dL Final   LDL Chol Calc (NIH)  Date Value Ref Range Status  10/04/2023 105 (H) 0 - 99 mg/dL Final   HDL  Date Value Ref Range Status  10/04/2023 42 >39 mg/dL Final   Triglycerides  Date Value Ref Range Status  10/04/2023 174 (H) 0 - 149 mg/dL Final         Passed - Cr in normal range and within 360 days    Creatinine, Ser  Date Value Ref Range Status  10/04/2023 0.74 0.57 - 1.00 mg/dL Final         Passed - Patient is not pregnant      Passed - Valid encounter within last 12 months    Recent Outpatient Visits           1 month ago Morbid obesity (HCC)   [place] Melvin Pao, NP   4 months ago Annual physical exam   [place] [facility] Melvin Pao, NP

## 2023-12-03 ENCOUNTER — Other Ambulatory Visit: Payer: Self-pay

## 2023-12-03 DIAGNOSIS — E118 Type 2 diabetes mellitus with unspecified complications: Secondary | ICD-10-CM

## 2023-12-03 DIAGNOSIS — E782 Mixed hyperlipidemia: Secondary | ICD-10-CM

## 2023-12-03 DIAGNOSIS — I10 Essential (primary) hypertension: Secondary | ICD-10-CM

## 2023-12-03 MED ORDER — ROSUVASTATIN CALCIUM 10 MG PO TABS: 10.0000 mg | ORAL_TABLET | Freq: Every day | ORAL | 1 refills | Status: DC

## 2023-12-03 NOTE — Progress Notes (Signed)
   12/03/2023  Patient ID: Kari Brandt, female   DOB: 03/08/1971, 53 y.o.   MRN: [id number]  Subjective/Objective Telephone visit to follow-up on management of chronic conditions   Diabetes Management -Current medications:  Ozempic  0.25mg  weekly, metformin  500mg  BID -Patient endorses forgetting second dose of metformin  500mg  somewhat regularly  -Patient has been taking Ozempic  0.25mg  weekly for several months now and is tolerating well.  She has also had weight loss of approximately 22lbs since starting the medication -Does not monitor home BG, but I have educated her on lower cost OTC testing supplies that can be purchased at [facility], [facility], or through [employer] -Recent A1c of 5.7% -Does not endorse any s/sx of hypoglycemia or hyperglycemia  Hypertension -Current medications:  amlodipine  2.5mg  daily, hydrochlorothiazide  25mg  daily, lisniopril 40mg  daily -BP well controlled with last OV reading of 107/73  Hyperlipidemia -Current medications:  rosuvastatin  10mg  daily  -LDL 7/14 elevated at 105, this was up from 82 in April -Patient states she has been out of rosuvastatin  10mg  2-3 weeks and has not been able to get a refill   Assessment/Plan   Diabetes Management -Controlled -Continue current regimen at this time -Patient desires to lose more weight, but does not wish to increase Ozempic  dose at this time -Sees PCP again 10/17 and will be due for A1c  Hypertension -Controlled -Continue current regimen and regular follow-up with PCP  Hyperlipidemia -Uncontrolled -Resume rosuvastatin  10mg  daily - order pending for PCP to sign if in agreement -I recommend follow-up lipid panel at next visit; if LDL remains >70, consider increasing rosuvastatin  to 20mg  daily   Follow-up:  3 months   Channing DELENA Mealing, PharmD, DPLA

## 2023-12-13 ENCOUNTER — Ambulatory Visit: Admitting: Internal Medicine

## 2023-12-13 ENCOUNTER — Ambulatory Visit: Payer: Self-pay

## 2023-12-13 ENCOUNTER — Encounter: Payer: Self-pay | Admitting: Internal Medicine

## 2023-12-13 VITALS — BP 124/78 | HR 70 | Temp 98.1°F | Ht 61.0 in | Wt 207.0 lb

## 2023-12-13 DIAGNOSIS — J3489 Other specified disorders of nose and nasal sinuses: Secondary | ICD-10-CM

## 2023-12-13 DIAGNOSIS — H1033 Unspecified acute conjunctivitis, bilateral: Secondary | ICD-10-CM | POA: Diagnosis not present

## 2023-12-13 DIAGNOSIS — R0981 Nasal congestion: Secondary | ICD-10-CM | POA: Diagnosis not present

## 2023-12-13 LAB — POC COVID19 BINAXNOW: SARS Coronavirus 2 Ag: NEGATIVE

## 2023-12-13 MED ORDER — NEOMYCIN-POLYMYXIN-DEXAMETH 3.5-10000-0.1 OP SUSP: 2.0000 [drp] | Freq: Four times a day (QID) | OPHTHALMIC | 0 refills | Status: DC

## 2023-12-13 NOTE — Progress Notes (Signed)
 Date:  12/13/2023   Name:  Selicia Windom   DOB:  [birth year]   MRN:  [id number]   Chief Complaint: Nasal Congestion (Nasal congestion, runny nose, sneezing, and eye redness x 2 days. No fever, sob, or cough.)  Conjunctivitis  The current episode started 2 days ago. The onset was sudden. The problem has been unchanged. Associated symptoms include congestion, eye discharge and eye redness. Pertinent negatives include no fever.    Review of Systems  Constitutional:  Negative for chills, fatigue and fever.  HENT:  Positive for congestion and postnasal drip.   Eyes:  Positive for discharge and redness.     Lab Results  Component Value Date   NA 141 10/04/2023   K 4.5 10/04/2023   CO2 21 10/04/2023   GLUCOSE 97 10/04/2023   BUN 13 10/04/2023   CREATININE 0.74 10/04/2023   CALCIUM  9.3 10/04/2023   EGFR 99 10/04/2023   GFRNONAA >60 10/14/2020   Lab Results  Component Value Date   CHOL 177 10/04/2023   HDL 42 10/04/2023   LDLCALC 105 (H) 10/04/2023   TRIG 174 (H) 10/04/2023   CHOLHDL 4.2 10/04/2023   Lab Results  Component Value Date   TSH 2.370 07/01/2023   Lab Results  Component Value Date   HGBA1C 5.7 (H) 10/04/2023   Lab Results  Component Value Date   WBC 7.8 07/01/2023   HGB 12.6 07/01/2023   HCT 39.2 07/01/2023   MCV 91 07/01/2023   PLT 312 07/01/2023   Lab Results  Component Value Date   ALT 24 10/04/2023   AST 20 10/04/2023   ALKPHOS 143 (H) 10/04/2023   BILITOT 0.4 10/04/2023   Lab Results  Component Value Date   VD25OH 35.1 12/31/2022     Patient Active Problem List   Diagnosis Date Noted   Diabetes mellitus treated with injections of non-insulin medication (HCC) 07/01/2023   Controlled diabetes mellitus type 2 with complications (HCC) 12/30/2022   Mixed hyperlipidemia 12/04/2020   Elevated troponin    Chest pain 07/09/2020   Morbid obesity (HCC) 02/04/2020   Vitamin D  deficiency 02/04/2020   Migraine 04/20/2019   Hypertension     Anxiety    Insomnia    GERD (gastroesophageal reflux disease)     No Known Allergies  Past Surgical History:  Procedure Laterality Date   BREAST SURGERY     ENDOMETRIAL ABLATION  2007   her option   TUBAL LIGATION     WRIST SURGERY      Social History   Tobacco Use   Smoking status: Never   Smokeless tobacco: Never  Vaping Use   Vaping status: Never Used  Substance Use Topics   Alcohol use: Yes    Comment: occassionally   Drug use: Never     Medication list has been reviewed and updated.  Current Meds  Medication Sig   amLODipine  (NORVASC ) 2.5 MG tablet Take 1 tablet by mouth once daily   Ascorbic Acid (VITAMIN C) 1000 MG tablet Take 1,000 mg by mouth daily.   ASPIRIN  81 PO Take 1 tablet by mouth daily.   busPIRone  (BUSPAR ) 15 MG tablet Take 1 tablet (15 mg total) by mouth 2 (two) times daily.   hydrochlorothiazide  (HYDRODIURIL ) 25 MG tablet TAKE 1 TABLET BY MOUTH ONCE DAILY FOR HIGH BLOOD PRESSURE   hydrocortisone  (PROCTO-MED HC ) 2.5 % rectal cream Place 1 Application rectally 2 (two) times daily.   lisinopril  (ZESTRIL ) 40 MG tablet Take 1 tablet (40  mg total) by mouth daily.   loratadine (CLARITIN) 10 MG tablet Take 10 mg by mouth daily.   metFORMIN  (GLUCOPHAGE ) 500 MG tablet TAKE 1 TABLET BY MOUTH TWICE DAILY WITH A MEAL (Patient taking differently: Take 500 mg by mouth daily with breakfast.)   neomycin -polymyxin b-dexamethasone (MAXITROL) 3.5-10000-0.1 SUSP Place 2 drops into both eyes every 6 (six) hours.   rosuvastatin  (CRESTOR ) 10 MG tablet Take 1 tablet (10 mg total) by mouth daily.   Semaglutide ,0.25 or 0.5MG /DOS, (OZEMPIC , 0.25 OR 0.5 MG/DOSE,) 2 MG/3ML SOPN Inject 0.25mg  into the skin weekly for 4 weeks, then increase to 0.25mg  weekly.  Please apply Novo copay card: Bin [id number], PCN [id number], Grp [id number], ID [id number]   triamcinolone  cream (KENALOG ) 0.1 % Apply 1 Application topically 2 (two) times daily.   VITAMIN D  PO Take 500 mg by mouth daily.        12/13/2023   11:35 AM 07/01/2023    8:36 AM 04/02/2023    9:46 AM 12/31/2022   10:46 AM  GAD 7 : Generalized Anxiety Score  Nervous, Anxious, on Edge 0 0 0 0  Control/stop worrying 0 0 0 0  Worry too much - different things 0 1 0 0  Trouble relaxing 0 1 1 0  Restless 0 0 1 1  Easily annoyed or irritable 0 0 0 0  Afraid - awful might happen 0 0 0 0  Total GAD 7 Score 0 2 2 1   Anxiety Difficulty Not difficult at all Not difficult at all  Not difficult at all       12/13/2023   11:35 AM 07/01/2023    8:35 AM 04/02/2023    9:47 AM  Depression screen PHQ 2/9  Decreased Interest 0 0 0  Down, Depressed, Hopeless 0 0 0  PHQ - 2 Score 0 0 0  Altered sleeping 0 1 1  Tired, decreased energy 0 0 0  Change in appetite 0 0 0  Feeling bad or failure about yourself  0 0 0  Trouble concentrating 0 0 0  Moving slowly or fidgety/restless 0 0 0  Suicidal thoughts 0 0 0  PHQ-9 Score 0 1 1  Difficult doing work/chores Not difficult at all Not difficult at all     BP Readings from Last 3 Encounters:  12/13/23 124/78  10/04/23 107/73  07/01/23 123/77    Physical Exam Constitutional:      Appearance: Normal appearance.  Eyes:     General: Lids are normal.     Extraocular Movements: Extraocular movements intact.     Conjunctiva/sclera:     Right eye: Right conjunctiva is injected. Chemosis and exudate present.     Left eye: Left conjunctiva is injected.  Cardiovascular:     Rate and Rhythm: Normal rate and regular rhythm.     Heart sounds: No murmur heard. Pulmonary:     Effort: Pulmonary effort is normal.     Breath sounds: No wheezing or rhonchi.  Lymphadenopathy:     Cervical: No cervical adenopathy.  Neurological:     Mental Status: She is alert.     Wt Readings from Last 3 Encounters:  12/13/23 207 lb (93.9 kg)  10/04/23 210 lb (95.3 kg)  07/01/23 214 lb 6.4 oz (97.3 kg)    BP 124/78   Pulse 70   Temp 98.1 F (36.7 C) (Oral)   Ht 5' 1 (1.549 m)   Wt 207 lb (93.9 kg)    SpO2 97%   BMI 39.11 kg/m  Assessment and Plan:  Problem List Items Addressed This Visit   None Visit Diagnoses       Acute bacterial conjunctivitis of both eyes    -  Primary   good hand hygiene and out of work today. follow up with PCP if symptoms worsen.   Relevant Medications   neomycin -polymyxin b-dexamethasone (MAXITROL) 3.5-10000-0.1 SUSP     Nasal congestion with rhinorrhea       Recommend Claritin or Allegra daily as needed for allergy symptoms Covid negative   Relevant Orders   POC COVID-19 BinaxNow (Completed)       No follow-ups on file.    Leita HILARIO Adie, MD [facility] [place]

## 2023-12-13 NOTE — Telephone Encounter (Signed)
 Patient wanted to be seen today No appointments in PCP office Appointment made for today 12/13/2023 at 11:20am with Dr Leita Adie at [facility]   FYI Only or Action Required?: Action required by provider: update on patient condition.  Patient was last seen in primary care on 10/04/2023 by Melvin Pao, NP.  Called Nurse Triage reporting Eye Problem.  Symptoms began 2 days ago.  Interventions attempted: Nothing.  Symptoms are: gradually worsening.  Triage Disposition: See Physician Within 24 Hours  Patient/caregiver understands and will follow disposition?: Yes                 Copied from CRM #[id number]. Topic: Clinical - Red Word Triage >> Dec 13, 2023 10:26 AM Kari Brandt wrote: Pink eye, itching and swelling Reason for Disposition  Yellow or green pus occurs  Answer Assessment - Initial Assessment Questions Two days ago both eyes were itching Last night right eye Sneezing & nasal drainage Patient is advised that if anything worsens to go to the Emergency Room. Patient verbalized understanding.    1. ONSET: When did the pain start? (e.g., minutes, hours, days)     Two days ago both eyes were irritated but last night the right eye seemed worse  2. TIMING: Does the pain come and go, or has it been constant since it started? (e.g., constant, intermittent, fleeting)     ---- 3. SEVERITY: How bad is the pain?  (Scale 1-10; mild, moderate or severe)     Just irritated 4. LOCATION: Where does it hurt?  (e.g., eyelid, eye, cheekbone)     Right eye irritated 5. CAUSE: What do you think is causing the pain?     unsure 6. VISION: Do you have blurred vision or changes in your vision?      States it gets a little blurry but clears up if she cleans her eye off 7. EYE DISCHARGE: Is there any discharge (pus) from the eye(s)?  If Yes, ask: What color is it?      Yes yellowish 8. FEVER: Do you have a fever? If Yes, ask: What is it, how was  it measured, and when did it start?      --- 9. OTHER SYMPTOMS: Do you have any other symptoms? (e.g., headache, nasal discharge, facial rash)     Runny nose, sneezing  Protocols used: Eye Pain and Other Symptoms-A-AH

## 2023-12-14 ENCOUNTER — Encounter: Payer: Self-pay | Admitting: Nurse Practitioner

## 2024-01-07 ENCOUNTER — Ambulatory Visit: Admitting: Nurse Practitioner

## 2024-01-11 ENCOUNTER — Other Ambulatory Visit: Payer: Self-pay | Admitting: Nurse Practitioner

## 2024-01-12 ENCOUNTER — Encounter: Payer: Self-pay | Admitting: Nurse Practitioner

## 2024-01-12 ENCOUNTER — Telehealth: Payer: Self-pay | Admitting: Nurse Practitioner

## 2024-01-12 ENCOUNTER — Ambulatory Visit: Admitting: Nurse Practitioner

## 2024-01-12 ENCOUNTER — Other Ambulatory Visit: Payer: Self-pay | Admitting: Nurse Practitioner

## 2024-01-12 VITALS — BP 112/72 | HR 67 | Temp 98.0°F | Ht 61.0 in | Wt 207.4 lb

## 2024-01-12 DIAGNOSIS — Z7985 Long-term (current) use of injectable non-insulin antidiabetic drugs: Secondary | ICD-10-CM

## 2024-01-12 DIAGNOSIS — Z23 Encounter for immunization: Secondary | ICD-10-CM | POA: Diagnosis not present

## 2024-01-12 DIAGNOSIS — E559 Vitamin D deficiency, unspecified: Secondary | ICD-10-CM | POA: Diagnosis not present

## 2024-01-12 DIAGNOSIS — E119 Type 2 diabetes mellitus without complications: Secondary | ICD-10-CM | POA: Diagnosis not present

## 2024-01-12 DIAGNOSIS — I10 Essential (primary) hypertension: Secondary | ICD-10-CM

## 2024-01-12 DIAGNOSIS — F419 Anxiety disorder, unspecified: Secondary | ICD-10-CM

## 2024-01-12 DIAGNOSIS — E782 Mixed hyperlipidemia: Secondary | ICD-10-CM

## 2024-01-12 DIAGNOSIS — E118 Type 2 diabetes mellitus with unspecified complications: Secondary | ICD-10-CM | POA: Diagnosis not present

## 2024-01-12 DIAGNOSIS — M25561 Pain in right knee: Secondary | ICD-10-CM

## 2024-01-12 LAB — MICROALBUMIN, URINE WAIVED
Creatinine, Urine Waived: 100 mg/dL (ref 10–300)
Microalb, Ur Waived: 10 mg/L (ref 0–19)
Microalb/Creat Ratio: <30 mg/g (ref <30)

## 2024-01-12 MED ORDER — AMLODIPINE BESYLATE 2.5 MG PO TABS: 2.5000 mg | ORAL_TABLET | Freq: Every day | ORAL | 1 refills | Status: AC

## 2024-01-12 MED ORDER — ROSUVASTATIN CALCIUM 10 MG PO TABS: 10.0000 mg | ORAL_TABLET | Freq: Every day | ORAL | 1 refills | Status: AC

## 2024-01-12 MED ORDER — METFORMIN HCL 500 MG PO TABS: 500.0000 mg | ORAL_TABLET | Freq: Every day | ORAL | 1 refills | Status: AC

## 2024-01-12 MED ORDER — HYDROCHLOROTHIAZIDE 25 MG PO TABS: 25.0000 mg | ORAL_TABLET | Freq: Every day | ORAL | 1 refills | Status: AC

## 2024-01-12 MED ORDER — BUSPIRONE HCL 15 MG PO TABS: 15.0000 mg | ORAL_TABLET | Freq: Two times a day (BID) | ORAL | 1 refills | Status: AC

## 2024-01-12 MED ORDER — LISINOPRIL 40 MG PO TABS: 40.0000 mg | ORAL_TABLET | Freq: Every day | ORAL | 1 refills | Status: AC

## 2024-01-12 MED ORDER — OZEMPIC (0.25 OR 0.5 MG/DOSE) 2 MG/3ML ~~LOC~~ SOPN: PEN_INJECTOR | SUBCUTANEOUS | 1 refills | Status: DC

## 2024-01-12 NOTE — Progress Notes (Signed)
 BP 112/72   Pulse 67   Temp 98 F (36.7 C) (Oral)   Ht 5' 1 (1.549 m)   Wt 207 lb 6.4 oz (94.1 kg)   SpO2 99%   BMI 39.19 kg/m    Subjective:    Patient ID: Kari Brandt, female    DOB: [birth year], 53 y.o.   MRN: [id number]  HPI: Kari Brandt is a 53 y.o. female  Chief Complaint  Patient presents with   Diabetes   Hyperlipidemia   Hypertension   Knee Pain    Patient states she has been having R knee pain for the last month. States it doesn't hurt all the time but she has heard a popping noise sometimes.    HYPERTENSION / HYPERLIPIDEMIA Satisfied with current treatment? yes Duration of hypertension: years BP monitoring frequency: sometimes BP range: 120/80 BP medication side effects: no Past BP meds: lisinopril  and Amlodipine   Duration of hyperlipidemia: years Cholesterol medication side effects: no Cholesterol supplements: none Past cholesterol medications: none Medication compliance: excellent compliance Aspirin : no Recent stressors: no Recurrent headaches: no Visual changes: no Palpitations: no Dyspnea: no Chest pain: no Lower extremity edema: no Dizzy/lightheaded: no  DIABETES Metformin  500mg  daily. Ozempic . Hypoglycemic episodes:no Polydipsia/polyuria: no Visual disturbance: no Chest pain: no Paresthesias: no Glucose Monitoring: no  Accucheck frequency: Not Checking  Fasting glucose:  Post prandial:  Evening:  Before meals: Taking Insulin?: no  Long acting insulin:  Short acting insulin: Blood Pressure Monitoring: weekly Retinal Examination: Will schedule with [facility] Foot Exam: Up to Date Diabetic Education: Not Completed Pneumovax: Up to Date Influenza: Up to Date Aspirin : no   ANXIETY Patient states her anxiety has been having some panic attacks.  Doesn't feel like it has been too bad.   She started taking the Buspar  in the morning helped with her panic attacks.  Feels like it is okay right now.   KNEE PAIN Patient  states she has been having right knee pain for over a month.  States she feels it popping.  Denies any injuries.  Rates the pain at a 9-10/10.  She has taken ibuprofen but doesn't feel like it has helped.  Doesn't feel like anything makes it better.  Prolonged sitting does seem to make it worse.  It does ease up after it loosens up a bit. Denies any swelling, bruising or warmth.  Relevant past medical, surgical, family and social history reviewed and updated as indicated. Interim medical history since our last visit reviewed. Allergies and medications reviewed and updated.  Review of Systems  Eyes:  Negative for visual disturbance.  Respiratory:  Negative for cough, chest tightness and shortness of breath.   Cardiovascular:  Negative for chest pain, palpitations and leg swelling.  Endocrine: Negative for polydipsia and polyuria.  Musculoskeletal:        Right knee pain  Neurological:  Negative for dizziness, numbness and headaches.  Psychiatric/Behavioral:  The patient is nervous/anxious.     Per HPI unless specifically indicated above     Objective:    BP 112/72   Pulse 67   Temp 98 F (36.7 C) (Oral)   Ht 5' 1 (1.549 m)   Wt 207 lb 6.4 oz (94.1 kg)   SpO2 99%   BMI 39.19 kg/m   Wt Readings from Last 3 Encounters:  01/12/24 207 lb 6.4 oz (94.1 kg)  12/13/23 207 lb (93.9 kg)  10/04/23 210 lb (95.3 kg)    Physical Exam Vitals and nursing note reviewed.  Constitutional:      General: She is not in acute distress.    Appearance: Normal appearance. She is not ill-appearing, toxic-appearing or diaphoretic.  HENT:     Head: Normocephalic.     Right Ear: External ear normal.     Left Ear: External ear normal.     Nose: Nose normal.     Mouth/Throat:     Mouth: Mucous membranes are moist.     Pharynx: Oropharynx is clear.  Eyes:     General:        Right eye: No discharge.        Left eye: No discharge.     Extraocular Movements: Extraocular movements intact.      Conjunctiva/sclera: Conjunctivae normal.     Pupils: Pupils are equal, round, and reactive to light.  Cardiovascular:     Rate and Rhythm: Normal rate and regular rhythm.     Heart sounds: No murmur heard. Pulmonary:     Effort: Pulmonary effort is normal. No respiratory distress.     Breath sounds: Normal breath sounds. No wheezing or rales.  Musculoskeletal:     Cervical back: Normal range of motion and neck supple.     Right knee: No swelling, deformity, effusion, erythema, ecchymosis, lacerations, bony tenderness or crepitus. Normal range of motion. Tenderness present over the medial joint line and lateral joint line. No MCL, LCL, ACL, PCL or patellar tendon tenderness. No LCL laxity, MCL laxity, ACL laxity or PCL laxity. Normal alignment, normal meniscus and normal patellar mobility. Normal pulse.  Skin:    General: Skin is warm and dry.     Capillary Refill: Capillary refill takes less than 2 seconds.  Neurological:     General: No focal deficit present.     Mental Status: She is alert and oriented to person, place, and time. Mental status is at baseline.  Psychiatric:        Mood and Affect: Mood normal.        Behavior: Behavior normal.        Thought Content: Thought content normal.        Judgment: Judgment normal.     Results for orders placed or performed in visit on 01/12/24  Microalbumin, Urine Waived   Collection Time: 01/12/24  9:00 AM  Result Value Ref Range   Microalb, Ur Waived 10 0 - 19 mg/L   Creatinine, Urine Waived 100 10 - 300 mg/dL   Microalb/Creat Ratio <30 <30 mg/g      Assessment & Plan:   Problem List Items Addressed This Visit       Cardiovascular and Mediastinum   Hypertension   Chronic. Controlled in office today.  Continue with Lisinopril  40mg , Amlodipine  2.5mg  and hydrochlorothiazide  25mg .  Recommend checking blood pressure daily at home and keeping a log.  Bring log to next visit.  Labs ordered at visit today.  Follow up in 6 months.  Call  sooner if concerns arise.       Relevant Medications   amLODipine  (NORVASC ) 2.5 MG tablet   hydrochlorothiazide  (HYDRODIURIL ) 25 MG tablet   lisinopril  (ZESTRIL ) 40 MG tablet   rosuvastatin  (CRESTOR ) 10 MG tablet     Endocrine   Controlled diabetes mellitus type 2 with complications (HCC)   Chronic.  Controlled.  Last A1c was 5.7%.  Taking Ozempic - not positive if she is taking 0.25mg  or 0.5mg  weekly (her husband gives her the shot).  Continue with Lisinopril  for Kidney protection.  Discussed eye exam.  Continue  with current medication regimen.  Labs ordered today.  Return to clinic in 6 months for reevaluation.  Call sooner if concerns arise.       Relevant Medications   metFORMIN  (GLUCOPHAGE ) 500 MG tablet   lisinopril  (ZESTRIL ) 40 MG tablet   rosuvastatin  (CRESTOR ) 10 MG tablet   Semaglutide ,0.25 or 0.5MG /DOS, (OZEMPIC , 0.25 OR 0.5 MG/DOSE,) 2 MG/3ML SOPN   Other Relevant Orders   Hemoglobin A1c   Microalbumin, Urine Waived (Completed)   Diabetes mellitus treated with injections of non-insulin medication (HCC) - Primary   Relevant Medications   metFORMIN  (GLUCOPHAGE ) 500 MG tablet   lisinopril  (ZESTRIL ) 40 MG tablet   rosuvastatin  (CRESTOR ) 10 MG tablet   Semaglutide ,0.25 or 0.5MG /DOS, (OZEMPIC , 0.25 OR 0.5 MG/DOSE,) 2 MG/3ML SOPN   Other Relevant Orders   Comprehensive metabolic panel with GFR   Hemoglobin A1c     Other   Anxiety   Chronic.  Improved with taking Buspar  in the mornings.  Continue with current medication regimen.  Follow up in 6 months.  Call sooner if concerns arise.         Relevant Medications   busPIRone  (BUSPAR ) 15 MG tablet   Morbid obesity (HCC)   Recommended eating smaller high protein, low fat meals more frequently and exercising 30 mins a day 5 times a week with a goal of 10-15lb weight loss in the next 3 months. Continue with Ozempic .      Relevant Medications   metFORMIN  (GLUCOPHAGE ) 500 MG tablet   Semaglutide ,0.25 or 0.5MG /DOS, (OZEMPIC ,  0.25 OR 0.5 MG/DOSE,) 2 MG/3ML SOPN   Vitamin D  deficiency   Labs ordered at visit today.  Will make recommendations based on lab results.        Relevant Orders   Vitamin D  (25 hydroxy)   Mixed hyperlipidemia   Chronic.  Continue with rosuvastatin .   Would benefit from increasing dose in the future.  Labs ordered.  Vaccines up to date.      Relevant Medications   amLODipine  (NORVASC ) 2.5 MG tablet   hydrochlorothiazide  (HYDRODIURIL ) 25 MG tablet   lisinopril  (ZESTRIL ) 40 MG tablet   rosuvastatin  (CRESTOR ) 10 MG tablet   Other Relevant Orders   Lipid panel   Other Visit Diagnoses       Acute pain of right knee       Joint line tenderness. Will order xray for evaluation of symptoms. Will make recommendations based on results.   Relevant Orders   DG Knee Complete 4 Views Right     Need for influenza vaccination       Relevant Orders   Flu vaccine trivalent PF, 6mos and older(Flulaval,Afluria,Fluarix,Fluzone) (Completed)        Follow up plan: Return in about 6 months (around 07/12/2024) for Physical and Fasting labs.

## 2024-01-12 NOTE — Assessment & Plan Note (Signed)
 Chronic.  Controlled.  Last A1c was 5.7%.  Taking Ozempic - not positive if she is taking 0.25mg  or 0.5mg  weekly (her husband gives her the shot).  Continue with Lisinopril  for Kidney protection.  Discussed eye exam.  Continue with current medication regimen.  Labs ordered today.  Return to clinic in 6 months for reevaluation.  Call sooner if concerns arise.

## 2024-01-12 NOTE — Telephone Encounter (Signed)
 Can we please call and ask her to ask her husband which dose she is taking? She wasn't sure in the office this morning which dose she was taking.

## 2024-01-12 NOTE — Assessment & Plan Note (Signed)
 Chronic.  Continue with rosuvastatin .   Would benefit from increasing dose in the future.  Labs ordered.  Vaccines up to date.

## 2024-01-12 NOTE — Telephone Encounter (Unsigned)
 Copied from CRM [id number]. Topic: Clinical - Medication Question >> Jan 12, 2024 10:21 AM Tiffini S wrote: Reason for CRM: Kari Brandt with [facility] pharmacy called with dosage questions for Semaglutide ,0.25 or 0.5MG /DOS, (OZEMPIC , 0.25 OR 0.5 MG/DOSE,) 2 MG/3ML SOPN- what's to know if refill should be for the 0.25 or 0.5  Please call [phone]

## 2024-01-12 NOTE — Assessment & Plan Note (Signed)
 Chronic. Controlled in office today.  Continue with Lisinopril  40mg , Amlodipine  2.5mg  and hydrochlorothiazide  25mg .  Recommend checking blood pressure daily at home and keeping a log.  Bring log to next visit.  Labs ordered at visit today.  Follow up in 6 months.  Call sooner if concerns arise.

## 2024-01-12 NOTE — Assessment & Plan Note (Signed)
 Chronic.  Improved with taking Buspar  in the mornings.  Continue with current medication regimen.  Follow up in 6 months.  Call sooner if concerns arise.

## 2024-01-12 NOTE — Assessment & Plan Note (Signed)
 Labs ordered at visit today.  Will make recommendations based on lab results.

## 2024-01-12 NOTE — Assessment & Plan Note (Signed)
 Recommended eating smaller high protein, low fat meals more frequently and exercising 30 mins a day 5 times a week with a goal of 10-15lb weight loss in the next 3 months.  Continue with Ozempic.

## 2024-01-13 ENCOUNTER — Ambulatory Visit: Payer: Self-pay | Admitting: Nurse Practitioner

## 2024-01-13 DIAGNOSIS — M25561 Pain in right knee: Secondary | ICD-10-CM

## 2024-01-13 LAB — LIPID PANEL
Chol/HDL Ratio: 3.2 ratio (ref 0.0–4.4)
Cholesterol, Total: 137 mg/dL (ref 100–199)
HDL: 43 mg/dL (ref >39)
LDL Chol Calc (NIH): 70 mg/dL (ref 0–99)
Triglycerides: 139 mg/dL (ref 0–149)
VLDL Cholesterol Cal: 24 mg/dL (ref 5–40)

## 2024-01-13 LAB — COMPREHENSIVE METABOLIC PANEL WITH GFR
ALT: 16 IU/L (ref 0–32)
AST: 15 IU/L (ref 0–40)
Albumin: 4.5 g/dL (ref 3.8–4.9)
Alkaline Phosphatase: 125 IU/L (ref 49–135)
BUN/Creatinine Ratio: 20 (ref 9–23)
BUN: 13 mg/dL (ref 6–24)
Bilirubin Total: 0.4 mg/dL (ref 0.0–1.2)
CO2: 22 mmol/L (ref 20–29)
Calcium: 10 mg/dL (ref 8.7–10.2)
Chloride: 101 mmol/L (ref 96–106)
Creatinine, Ser: 0.65 mg/dL (ref 0.57–1.00)
Globulin, Total: 2.3 g/dL (ref 1.5–4.5)
Glucose: 87 mg/dL (ref 70–99)
Potassium: 4.1 mmol/L (ref 3.5–5.2)
Sodium: 142 mmol/L (ref 134–144)
Total Protein: 6.8 g/dL (ref 6.0–8.5)
eGFR: 107 mL/min/1.73 (ref >59)

## 2024-01-13 LAB — HEMOGLOBIN A1C
Est. average glucose Bld gHb Est-mCnc: 120 mg/dL
Hgb A1c MFr Bld: 5.8 % — ABNORMAL HIGH (ref 4.8–5.6)

## 2024-01-13 LAB — VITAMIN D 25 HYDROXY (VIT D DEFICIENCY, FRACTURES): Vit D, 25-Hydroxy: 37 ng/mL (ref 30.0–100.0)

## 2024-01-13 NOTE — Telephone Encounter (Signed)
 Requested Prescriptions  Refused Prescriptions Disp Refills   amLODipine  (NORVASC ) 2.5 MG tablet [Pharmacy Med Name: amLODIPine  Besylate 2.5 MG Oral Tablet] 90 tablet 0    Sig: Take 1 tablet by mouth once daily     Cardiovascular: Calcium  Channel Blockers 2 Passed - 01/13/2024  4:47 PM      Passed - Last BP in normal range    BP Readings from Last 1 Encounters:  01/12/24 112/72         Passed - Last Heart Rate in normal range    Pulse Readings from Last 1 Encounters:  01/12/24 67         Passed - Valid encounter within last 6 months    Recent Outpatient Visits           Yesterday Diabetes mellitus treated with injections of non-insulin medication (HCC)   [place] [facility] Melvin Pao, NP   1 month ago Acute bacterial conjunctivitis of both eyes   [place] [facility], Leita DEL, MD   3 months ago Morbid obesity [facility])   [place] [facility] Melvin Pao, NP   6 months ago Annual physical exam    [facility] Melvin Pao, NP

## 2024-01-13 NOTE — Telephone Encounter (Signed)
 Requested Prescriptions  Refused Prescriptions Disp Refills   hydrochlorothiazide  (HYDRODIURIL ) 25 MG tablet [Pharmacy Med Name: hydroCHLOROthiazide  25 MG Oral Tablet] 90 tablet 0    Sig: TAKE 1 TABLET BY MOUTH ONCE DAILY FOR HIGH BLOOD PRESSURE     Cardiovascular: Diuretics - Thiazide Passed - 01/13/2024 10:11 AM      Passed - Cr in normal range and within 180 days    Creatinine, Ser  Date Value Ref Range Status  01/12/2024 0.65 0.57 - 1.00 mg/dL Final         Passed - K in normal range and within 180 days    Potassium  Date Value Ref Range Status  01/12/2024 4.1 3.5 - 5.2 mmol/L Final         Passed - Na in normal range and within 180 days    Sodium  Date Value Ref Range Status  01/12/2024 142 134 - 144 mmol/L Final         Passed - Last BP in normal range    BP Readings from Last 1 Encounters:  01/12/24 112/72         Passed - Valid encounter within last 6 months    Recent Outpatient Visits           Yesterday Diabetes mellitus treated with injections of non-insulin medication [facility])   [facility] Melvin Pao, NP   1 month ago Acute bacterial conjunctivitis of both eyes   [place] [facility], Leita DEL, MD   3 months ago Morbid obesity [facility])   [place] [facility] Melvin Pao, NP   6 months ago Annual physical exam   [place] [facility] Melvin Pao, NP

## 2024-01-14 MED ORDER — OZEMPIC (0.25 OR 0.5 MG/DOSE) 2 MG/3ML ~~LOC~~ SOPN: 0.5000 mg | PEN_INJECTOR | SUBCUTANEOUS | 1 refills | Status: AC

## 2024-01-17 ENCOUNTER — Ambulatory Visit
Admission: RE | Admit: 2024-01-17 | Discharge: 2024-01-17 | Disposition: A | Discharge status: Home / Self Care | Source: Ambulatory Visit | Attending: Nurse Practitioner | Admitting: Nurse Practitioner

## 2024-01-17 DIAGNOSIS — M25561 Pain in right knee: Secondary | ICD-10-CM | POA: Insufficient documentation

## 2024-01-24 ENCOUNTER — Encounter: Payer: Self-pay | Admitting: Radiology

## 2024-01-28 ENCOUNTER — Ambulatory Visit (INDEPENDENT_AMBULATORY_CARE_PROVIDER_SITE_OTHER)

## 2024-01-28 DIAGNOSIS — M1711 Unilateral primary osteoarthritis, right knee: Secondary | ICD-10-CM

## 2024-01-28 NOTE — Patient Instructions (Signed)

## 2024-01-28 NOTE — Progress Notes (Signed)
 Orthopaedic Surgery New Patient Visit   History of Present Illness: The patient is a 53 y.o. female seen in clinic for history of right knee pain.  No known precipitating injury/trauma.  Patient denies any new activity.  Patient reports pain along the inferior aspect of the patella and inside part of the knee that wraps around to the posterior aspect of the knee.  Describes as a stabbing sensation.  No pain at rest.  Pain most severe in the morning, resolves with a little bit of use, and then returns again at the end of the evening.  Exacerbated with going up and down stairs.  Exacerbated with bending down to get on the floor and then get back up again.  Has taken over-the-counter ibuprofen and applied ice with no symptom improvement.  Denies knee edema/effusion.  Denies radiating pain.  Denies lower extremity weakness/paresthesias.  Denies locking/catching sensation.  Denies buckling/instability.  Patient states pain has not caused her to be unable to perform any of her normal activities, including work.   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  Previous history of plantar fasciitis and posterior tibial tendinitis that was managed by [facility].  Patient denies having orthotics.  Patient has non-insulin dependent diabetes.  On Metformin  and Ozempic . Last A1c 5.8% on 01/12/2024 (two weeks ago).   Patient does report that the past week and a half her symptoms have significantly improved, states she is 90% back to her normal.  Patient seen by PCP, Darice Petty NP with [facility], on 01/12/2024 (2-1/2 weeks ago). Reported right knee pain. Xray ordered and patient referred to orthopedist.    Past Medical, Social and Family History: Past Medical History:  Diagnosis Date   Anxiety    GERD (gastroesophageal reflux disease)    Hypertension    Past Surgical History:  Procedure  Laterality Date   BREAST SURGERY     ENDOMETRIAL ABLATION  2007   her option   TUBAL LIGATION     WRIST SURGERY     No Known Allergies Current Outpatient Medications on File Prior to Visit  Medication Sig Dispense Refill   amLODipine  (NORVASC ) 2.5 MG tablet Take 1 tablet (2.5 mg total) by mouth daily. 90 tablet 1   Ascorbic Acid (VITAMIN C) 1000 MG tablet Take 1,000 mg by mouth daily.     ASPIRIN  81 PO Take 1 tablet by mouth daily.     busPIRone  (BUSPAR ) 15 MG tablet Take 1 tablet (15 mg total) by mouth 2 (two) times daily. 180 tablet 1   hydrochlorothiazide  (HYDRODIURIL ) 25 MG tablet Take 1 tablet (25 mg total) by mouth daily. for high blood pressure 90 tablet 1   hydrocortisone  (PROCTO-MED HC ) 2.5 % rectal cream Place 1 Application rectally 2 (two) times daily. 30 g 0   lisinopril  (ZESTRIL ) 40 MG tablet Take 1 tablet (40 mg total) by mouth daily. 90 tablet 1   loratadine (CLARITIN) 10 MG tablet Take 10 mg by mouth daily.     metFORMIN  (GLUCOPHAGE ) 500 MG tablet Take 1 tablet (500 mg total) by mouth daily with breakfast. 90 tablet 1   rosuvastatin  (CRESTOR ) 10 MG tablet Take 1 tablet (10 mg total) by mouth daily. 90 tablet 1   Semaglutide ,0.25 or 0.5MG /DOS, (OZEMPIC , 0.25 OR 0.5 MG/DOSE,) 2 MG/3ML SOPN Inject 0.5 mg into the skin once a week. 6 mL 1   triamcinolone   cream (KENALOG ) 0.1 % Apply 1 Application topically 2 (two) times daily. 30 g 1   No current facility-administered medications on file prior to visit.   Social History   Tobacco Use   Smoking status: Never   Smokeless tobacco: Never  Vaping Use   Vaping status: Never Used  Substance Use Topics   Alcohol use: Yes    Comment: occassionally   Drug use: Never      I have reviewed past medical, surgical, social and family history, medications and allergies as documented in the EMR.  Review of Systems - A ROS was performed including pertinent positives and negatives as documented in the HPI.     Physical  Exam:  General/Constitutional: NAD Vascular: No edema, swelling or tenderness, except as noted in detailed exam Integumentary: No impressive skin lesions present, except as noted in detailed exam Neuro/Psych: Normal mood and affect, oriented to person, place and time Musculoskeletal: Normal, except as noted in detailed exam and in HPI No gait abnormality   Focused Orthopaedic Examination:  Right Knee Examination (focused):   RIGHT  AROM (degrees) PROM (degrees)  0-130 0-130  Palpation (pain): Effusion not significant   Medial joint line tenderness positive   Lateral joint line tenderness negative  Instability: Varus @ 0 degrees            @ 30 degrees none none   Valgus @ 0 degrees             @ 30 degrees none none  Special Tests: Lachman's negative   Posterior drawer none   Anterior drawer none   Pivot shift   Not performed   McMurray negative   Dial @ 30 degrees         @ 90 degrees Not performed Not performed  Patella: Palpation (pain) negative   Mobility < 2 quadrants   Apprehension negative  Other: Knee flexion strength   5/5   Knee extension strength  5/5   Focal areas of tenderness to palpation: Tenderness with palpation along medial joint line. Very minimal tenderness with palpation over pes anserine. Minimal tenderness with firm patellar grind test.  Vascular/Lymphatic: 2+ dorsalis pedis/posterior tibialis pulses, foot warm and well perfused Neurologic: Sensation intact to light touch to Superficial peroneal/Deep peroneal/Tibial/Sural/Saphenous nerves      XR Right Knee Imaging: X-rays of the Right knee including 4-views (AP, lateral, tunnel, skyline) obtained on 01/17/2024  by [facility] were reviewed personally by me.  Per my independent interpretation these images show no acute fracture or dislocation.  Medial joint space narrowing.    Radiology Read:  Right Knee Pain Xray 01/17/2024 FINDINGS:   BONES AND  JOINTS: Well-corticated density along the fibular head, likely old healed fracture. No acute displaced fracture. No focal osseous lesion. No joint dislocation. No significant joint effusion. No significant degenerative changes.   SOFT TISSUES: The soft tissues are unremarkable.   IMPRESSION: 1. No acute displaced fracture or dislocation.  Assessment:  Right knee osteoarthritis  Plan:  Patient was seen and examined in office today. We reviewed patient's history, examination, and imaging in detail. Based on information available for this encounter, patient with 2-1/42-month history of right knee pain.  No known precipitating injury/trauma.  Physical exam reveals tenderness at medial joint line.  Right knee x-ray/imaging reveals mild to moderate loss of medial joint space.  Patient denies any symptoms of internal derangement and no concerning findings on physical exam.  Patient symptoms significantly improving over the past  week and a half, reports she feels she is 90% back to normal.  Discussed patient's diagnosis, pathophysiology, typical disease progression, and treatment options.  Discussed conservative management of rest, diet/exercise, ice/heat, topical analgesics 6, over-the-counter or prescription NSAIDs, intra-articular steroid injections, formal physical therapy.  Patient at this time will perform HEP and use OTC Tylenol  or ibuprofen prn. Given [facility] knee conditioning program handout.  Advised patient to return to office if symptoms do not continue to improve or worsen, or with any new concern.   Patient education material was provided.  All questions, concerns and comments were addressed to the best of my ability.  Follow-up: As needed   Arlyss GEANNIE Schneider, DO Orthopedic Surgery & Sports Medicine [place]   This document was dictated using Dragon voice recognition software. A reasonable attempt at proof reading has been made to minimize errors.

## 2024-03-02 NOTE — Progress Notes (Unsigned)
° °  03/03/24  Patient ID: Kari Brandt, female   DOB: [birth year], 53 y.o.   MRN: [id number]  Subjective/Objective Telephone visit to follow-up on management of chronic conditions   Diabetes Management -Current medications:  Ozempic  0.5mg  weekly, Metformin  500mg  daily -Ozempic  increased to 0.5mg  weekly around the end of October, and patient endorses tolerating well with no adverse side effects noted -She does not check home BG, but A1c has been well controlled -Patient has lost approximately 20lbs since starting Ozempic  several months ago -Does not endorse any s/sx of hypoglycemia or hyperglycemia -UACR <30 on 10/22 Lab Results  Component Value Date   HGBA1C 5.8 (H) 01/12/2024   HGBA1C 5.7 (H) 10/04/2023   HGBA1C 6.3 (H) 07/01/2023   Hypertension -Current medications:  amlodipine  2.5mg  daily, hydrochlorothiazide  25mg  daily, lisniopril 40mg  daily -BP well controlled with last OV reading of 112/72 on 10/122 -Patient states she monitors home BP at home on occasion; she does not provide readings but state these are within normal range  Hyperlipidemia -Current medications:  rosuvastatin  10mg  daily  -Recent LDL improved, down from 105 previously     Component Value Date/Time   CHOL 137 01/12/2024 0901   TRIG 139 01/12/2024 0901   HDL 43 01/12/2024 0901   CHOLHDL 3.2 01/12/2024 0901   CHOLHDL 5.9 07/10/2020 0447   VLDL 49 (H) 07/10/2020 0447   LDLCALC 70 01/12/2024 0901   LABVLDL 24 01/12/2024 0901    Assessment/Plan   Diabetes Management -Controlled with A1c <7% -UACR at goal of <30 -Continue current regimen at this time -Sees PCP again 4/22 and will be due for A1c.  Could consider increasing Ozempic  1 mg if patient continues to tolerate well and desires additional weight loss benefit.  Hypertension -Controlled with BP <130/80 -Continue current regimen and regular follow-up with PCP  Hyperlipidemia -Controlled with LDL of 70 -Continue current regimen -Sees PCP  again 4/22, and I would recommend a follow-up lipid panel at that time  Follow-up:  6 months   Channing DELENA Mealing, PharmD, DPLA

## 2024-03-03 ENCOUNTER — Other Ambulatory Visit (INDEPENDENT_AMBULATORY_CARE_PROVIDER_SITE_OTHER): Payer: Self-pay

## 2024-03-03 DIAGNOSIS — I10 Essential (primary) hypertension: Secondary | ICD-10-CM

## 2024-03-03 DIAGNOSIS — E118 Type 2 diabetes mellitus with unspecified complications: Secondary | ICD-10-CM

## 2024-03-03 DIAGNOSIS — E782 Mixed hyperlipidemia: Secondary | ICD-10-CM

## 2024-03-07 ENCOUNTER — Telehealth: Payer: Self-pay | Admitting: Pharmacy Technician

## 2024-03-07 ENCOUNTER — Other Ambulatory Visit (HOSPITAL_COMMUNITY): Payer: Self-pay

## 2024-03-07 NOTE — Telephone Encounter (Signed)
 Pharmacy Patient Advocate Encounter   Received notification from Onbase that prior authorization for Ozempic  (0.25 or 0.5 MG/DOSE) 2MG /3ML pen-injectors is due for renewal.   Insurance verification completed.   The patient is insured through [employer].  Action: Medication is now available without a prior authorization.

## 2024-04-01 ENCOUNTER — Emergency Department

## 2024-04-01 ENCOUNTER — Other Ambulatory Visit: Payer: Self-pay

## 2024-04-01 ENCOUNTER — Emergency Department: Admission: EM | Admit: 2024-04-01 | Discharge: 2024-04-01 | Disposition: A | Discharge status: Home / Self Care

## 2024-04-01 DIAGNOSIS — I471 Supraventricular tachycardia, unspecified: Secondary | ICD-10-CM | POA: Insufficient documentation

## 2024-04-01 DIAGNOSIS — R000 Tachycardia, unspecified: Secondary | ICD-10-CM | POA: Diagnosis present

## 2024-04-01 LAB — CBC
HCT: 40.2 % (ref 36.0–46.0)
Hemoglobin: 13.4 g/dL (ref 12.0–15.0)
MCH: 29.3 pg (ref 26.0–34.0)
MCHC: 33.3 g/dL (ref 30.0–36.0)
MCV: 88 fL (ref 80.0–100.0)
Platelets: 373 K/uL (ref 150–400)
RBC: 4.57 MIL/uL (ref 3.87–5.11)
RDW: 13.4 % (ref 11.5–15.5)
WBC: 10.3 K/uL (ref 4.0–10.5)
nRBC: 0 % (ref 0.0–0.2)

## 2024-04-01 LAB — BASIC METABOLIC PANEL WITH GFR
Anion gap: 10 (ref 5–15)
BUN: 17 mg/dL (ref 6–20)
CO2: 25 mmol/L (ref 22–32)
Calcium: 8.9 mg/dL (ref 8.9–10.3)
Chloride: 109 mmol/L (ref 98–111)
Creatinine, Ser: 0.83 mg/dL (ref 0.44–1.00)
GFR, Estimated: >60 mL/min
Glucose, Bld: 144 mg/dL — ABNORMAL HIGH (ref 70–99)
Potassium: 3.8 mmol/L (ref 3.5–5.1)
Sodium: 144 mmol/L (ref 135–145)

## 2024-04-01 LAB — TROPONIN T, HIGH SENSITIVITY: Troponin T High Sensitivity: 28 ng/L — ABNORMAL HIGH (ref 0–19)

## 2024-04-01 MED ORDER — DILTIAZEM HCL 25 MG/5ML IV SOLN
INTRAVENOUS | Status: AC
  Administered 2024-04-01: 15 mg via INTRAVENOUS
  Filled 2024-04-01: qty 5

## 2024-04-01 MED ORDER — ADENOSINE 6 MG/2ML IV SOLN
INTRAVENOUS | Status: AC
  Filled 2024-04-01: qty 2

## 2024-04-01 MED ORDER — DILTIAZEM HCL 60 MG PO TABS
60.0000 mg | ORAL_TABLET | Freq: Once | ORAL | Status: AC
  Administered 2024-04-01: 60 mg via ORAL
  Filled 2024-04-01: qty 1

## 2024-04-01 MED ORDER — SODIUM CHLORIDE 0.9 % IV BOLUS
1000.0000 mL | Freq: Once | INTRAVENOUS | Status: AC
  Administered 2024-04-01: 1000 mL via INTRAVENOUS

## 2024-04-01 MED ORDER — DILTIAZEM HCL 25 MG/5ML IV SOLN: 15.0000 mg | Freq: Once | INTRAVENOUS | Status: AC

## 2024-04-01 MED ORDER — DILTIAZEM HCL ER 120 MG PO CP24: 120.0000 mg | ORAL_CAPSULE | Freq: Every day | ORAL | 0 refills | Status: AC

## 2024-04-01 MED ORDER — ADENOSINE 12 MG/4ML IV SOLN
INTRAVENOUS | Status: AC
  Filled 2024-04-01: qty 4

## 2024-04-01 NOTE — ED Provider Notes (Signed)
 "  [facility] Provider Note    Event Date/Time   First MD Initiated Contact with Patient 04/01/24 1355     (approximate)   History   No chief complaint on file.   HPI  Kari Brandt is a 54 y.o. female presenting with concern of rapid heart rate.  She feels that she has been having panic attacks frequently, but generally they go away on their own, today around 9 AM had a panic attack, while she was helping her son's dog.  Since then persistent sensation of palpitations lightheadedness which feels like her normal panic attack.  She went to the local fire station they found her heart rate to be in the 200s and had the patient brought into the emergency department for further assessment and evaluation.  She denies associated chest pain, no known history of cardiac disease.  She states she does have a history of diabetes and high blood pressure.  No other complaints at this time has not noticed any swelling in her extremities.  She states that she otherwise feels well.  Per EMS she was given a dose of 6 mg and 12 mg of adenosine .     Physical Exam   Triage Vital Signs: ED Triage Vitals  Encounter Vitals Group     BP 04/01/24 1345 100/84     Girls Systolic BP Percentile --      Girls Diastolic BP Percentile --      Boys Systolic BP Percentile --      Boys Diastolic BP Percentile --      Pulse Rate 04/01/24 1345 (!) 124     Resp 04/01/24 1345 (!) 22     Temp --      Temp src --      SpO2 04/01/24 1345 100 %     Weight 04/01/24 1343 210 lb (95.3 kg)     Height 04/01/24 1343 5' 3 (1.6 m)     Head Circumference --      Peak Flow --      Pain Score 04/01/24 1343 0     Pain Loc --      Pain Education --      Exclude from Growth Chart --     Most recent vital signs: Vitals:   04/01/24 1404 04/01/24 1420  BP:    Pulse: 82 90  Resp: 14 18  Temp:    SpO2: 100% 100%     General: Awake, no distress.  CV:  Good peripheral perfusion.  Tachycardic,  extremities are nonswollen Resp:  Normal effort.  Abd:  No distention.  Soft nontender Other:     ED Results / Procedures / Treatments   Labs (all labs ordered are listed, but only abnormal results are displayed) Labs Reviewed  BASIC METABOLIC PANEL WITH GFR - Abnormal; Notable for the following components:      Result Value   Glucose, Bld 144 (*)    All other components within normal limits  TROPONIN T, HIGH SENSITIVITY - Abnormal; Notable for the following components:   Troponin T High Sensitivity 28 (*)    All other components within normal limits  CBC     EKG  My independent interpretation of this EKG demonstrates a tachycardic rhythm with a rate of about 200, axis of about 70, intervals appear to be within normal limits, this appears consistent with SVT   RADIOLOGY No acute findings on my independent interpretation of this chest x-ray  PROCEDURES:  Critical  Care performed: Yes, see critical care procedure note(s)  .Critical Care  Performed by: Fernand Rossie HERO, MD Authorized by: Fernand Rossie HERO, MD   Critical care provider statement:    Critical care time (minutes):  30   Critical care was necessary to treat or prevent imminent or life-threatening deterioration of the following conditions:  Circulatory failure, cardiac failure and shock   Critical care was time spent personally by me on the following activities:  Development of treatment plan with patient or surrogate, evaluation of patient's response to treatment, examination of patient, ordering and review of laboratory studies, ordering and review of radiographic studies, ordering and performing treatments and interventions, pulse oximetry, re-evaluation of patient's condition and review of old charts    MEDICATIONS ORDERED IN ED: Medications  adenosine  (ADENOCARD ) 6 MG/2ML injection (  Not Given 04/01/24 1404)  adenosine  (ADENOCARD ) 12 MG/4ML injection (  Not Given 04/01/24 1404)  diltiazem  (CARDIZEM ) injection  15 mg (15 mg Intravenous Given 04/01/24 1354)  sodium chloride  0.9 % bolus 1,000 mL (0 mLs Intravenous Stopped 04/01/24 1425)  diltiazem  (CARDIZEM ) tablet 60 mg (60 mg Oral Given 04/01/24 1431)     IMPRESSION / MDM / ASSESSMENT AND PLAN / ED COURSE  I reviewed the triage vital signs and the nursing notes.                               Patient's presentation is most consistent with acute presentation with potential threat to life or bodily function.  54 year old female who presents today with concern of tachycardia.  Appears to be in SVT here.  She breaks out of the rhythm and returns back to sinus rhythm while in the exam room and then repeatedly goes in and out of SVT multiple times during our conversation.  I will give a dose of diltiazem  here as unfortunately would be of limited utility given her rapid breaks and rhythm.  Will follow her response to this and determine appropriate and safe disposition accordingly.   Clinical Course as of 04/01/24 1506  Sat Apr 01, 2024  1411 Patient heart rate has significantly improved. Will give dose of oral diltiazem  here and if stable can be discharged with cardiology follow up. [SK]  1502 Patient heart rate remained stable here.  Given the presentation and given the significant improvement we will have her discharged home with a brief course of diltiazem , I discussed return precautions, patient will follow-up with cardiology in the outpatient setting.  Troponin is slightly elevated, I suspect this is secondary to her significant tachycardia I do not believe this is consistent with ACS. [SK]    Clinical Course User Index [SK] Fernand Rossie HERO, MD     FINAL CLINICAL IMPRESSION(S) / ED DIAGNOSES   Final diagnoses:  SVT (supraventricular tachycardia)     Rx / DC Orders   ED Discharge Orders          Ordered    diltiazem  (DILACOR XR ) 120 MG 24 hr capsule  Daily        04/01/24 1504    Ambulatory referral to Cardiology       Comments: If you  have not heard from the Cardiology office within the next 72 hours please call [phone].   04/01/24 1504             Note:  This document was prepared using Dragon voice recognition software and may include unintentional dictation errors.   Fernand,  Rossie HERO, MD 04/01/24 1506  "

## 2024-04-01 NOTE — ED Notes (Signed)
 Pt given warm blanket and socks. Husband bedside at this time.

## 2024-04-01 NOTE — ED Triage Notes (Signed)
 Pt from fire dept for tachycardia. HR 202-215- per EMS was given vagals maneuvers 6 and 12 mg adenosine  w/out improvements. Pt denies CP, palpitations, HSOB, dizziness. No cardiac history. CBG 134. 500 ml nacl bolus infusing.

## 2024-04-01 NOTE — Discharge Instructions (Signed)
 You were seen today due to concern of a rapid heart rate.  At this time fortunately your symptoms have improved.  I have written for some medications for you to take, please take these as instructed.  I would recommend following up with a cardiologist, I placed a referral that should be contacting you to help arrange a follow-up appointment.  If you have any worsening symptoms such as increased chest pain shortness of breath lightheadedness or any other symptoms you find concerning please return to the emergency department immediately for further medical management.

## 2024-04-11 ENCOUNTER — Ambulatory Visit

## 2024-04-11 VITALS — BP 118/70 | HR 77 | Ht 60.0 in | Wt 212.0 lb

## 2024-04-11 DIAGNOSIS — R002 Palpitations: Secondary | ICD-10-CM | POA: Diagnosis not present

## 2024-04-11 DIAGNOSIS — I479 Paroxysmal tachycardia, unspecified: Secondary | ICD-10-CM

## 2024-04-11 DIAGNOSIS — E782 Mixed hyperlipidemia: Secondary | ICD-10-CM

## 2024-04-11 DIAGNOSIS — I471 Supraventricular tachycardia, unspecified: Secondary | ICD-10-CM

## 2024-04-11 NOTE — Patient Instructions (Addendum)
 Medication Instructions:  Your physician recommends that you continue on your current medications as directed. Please refer to the Current Medication list given to you today.  *If you need a refill on your cardiac medications before your next appointment, please call your pharmacy*  Lab Work: No labs ordered today  If you have labs (blood work) drawn today and your tests are completely normal, you will receive your results only by: MyChart Message (if you have MyChart) OR A paper copy in the mail If you have any lab test that is abnormal or we need to change your treatment, we will call you to review the results.  Testing/Procedures: Your physician has requested that you have an echocardiogram. Echocardiography is a painless test that uses sound waves to create images of your heart. It provides your doctor with information about the size and shape of your heart and how well your hearts chambers and valves are working.   You may receive an ultrasound enhancing agent through an IV if needed to better visualize your heart during the echo. This procedure takes approximately one hour.  There are no restrictions for this procedure.  This will take place at [address] [facility] [address], [place] [address]  Please note: We ask at that you not bring children with you during ultrasound (echo/ vascular) testing. Due to room size and safety concerns, children are not allowed in the ultrasound rooms during exams. Our front office staff cannot provide observation of children in our lobby area while testing is being conducted. An adult accompanying a patient to their appointment will only be allowed in the ultrasound room at the discretion of the ultrasound technician under special circumstances. We apologize for any inconvenience.   ZIO XT- Long Term Monitor Instructions  Your physician has requested you wear a ZIO patch monitor for 14 days.  This is a single patch monitor. [employer]  supplies one patch monitor per enrollment. Additional stickers are not available. Please do not apply patch if you will be having a Nuclear Stress Test, Echocardiogram, Cardiac CT, MRI, or Chest Xray during the period you would be wearing the monitor. The patch cannot be worn during these tests. You cannot remove and re-apply the ZIO XT patch monitor.  Your ZIO patch monitor will be mailed 3 day USPS to your address on file. It may take 3-5 days to receive your monitor after you have been enrolled. Once you have received your monitor, please review the enclosed instructions. Your monitor has already been registered assigning a specific monitor serial number to you.  Billing and Patient Assistance Program Information  We have supplied [employer] with any of your insurance information on file for billing purposes.  [employer] offers a sliding scale Patient Assistance Program for patients that do not have insurance, or whose insurance does not completely cover the cost of the ZIO monitor.  You must apply for the Patient Assistance Program to qualify for this discounted rate.  To apply, please call [employer] at [phone], select option 4, select option 2, ask to apply for Patient Assistance Program. Meredeth will ask your household income, and how many people are in your household. They will quote your out-of-pocket cost based on that information. [employer] will also be able to set up a 79-month, interest-free payment plan if needed.  Applying the monitor   Hold abrader disc by orange tab. Rub abrader in 40 strokes over the upper left chest as indicated in your monitor instructions.  Clean area with  4 enclosed alcohol pads. Let dry.  Apply patch as indicated in monitor instructions. Patch will be placed under collarbone on left side of chest with arrow pointing upward.  Rub patch adhesive wings for 2 minutes. Remove white label marked 1. Remove the white label marked 2. Rub patch adhesive wings for 2 additional  minutes.  While looking in a mirror, press and release button in center of patch. A small green light will flash 3-4 times. This will be your only indicator that the monitor has been turned on.   After Applying Monitor: Do not shower for the first 24 hours. You may shower after the first 24 hours. However, you must keep your back toward the water; monitor cannot be submerged under water. Press the button if you feel a symptom. You will hear a small click. Record Date, Time and Symptom in the Patient Logbook.   After Completing 14 Days: When you are ready to remove the patch, follow instructions on the last 2 pages of Patient Logbook.  Stick patch monitor into the tabs at the bottom of the return box.  Place Patient Logbook in the blue and white box. Use locking tab on box and tape box closed securely. The blue and white box has prepaid postage on it. Please place it in the mailbox as soon as possible. Your physician should have your test results approximately 7-14 days after the monitor has been mailed back to [facility].   Troubleshooting: Call [facility] at [phone] if you have questions regarding your ZIO XT patch monitor.  Call them immediately if you see an orange light blinking on your monitor.  If your monitor falls off in less than 4 days, contact our Monitor department at [phone].  If your monitor becomes loose or falls off after 4 days call [employer] at [phone] for suggestions on securing your monitor.   Follow-Up: At [facility], you and your health needs are our priority.  As part of our continuing mission to provide you with exceptional heart care, our providers are all part of one team.  This team includes your primary Cardiologist (physician) and Advanced Practice Providers or APPs (Physician Assistants and Nurse Practitioners) who all work together to provide you with the care you need, when you need it.  Your next appointment:  3  month(s)  Provider:  Caron Poser, MD    We recommend signing up for the patient portal called MyChart.  Sign up information is provided on this After Visit Summary.  MyChart is used to connect with patients for Virtual Visits (Telemedicine).  Patients are able to view lab/test results, encounter notes, upcoming appointments, etc.  Non-urgent messages can be sent to your provider as well.   To learn more about what you can do with MyChart, go to [email or web address].   Other Instructions  What is the Valsalva maneuver?  The Valsalva maneuver is a breathing technique where you forcefully exhale against a closed airway (mouth and nose closed). It temporarily changes pressure in your chest, which affects heart rate and blood pressure.   Syringe method (often used in medical settings)  Blow into the tip of a syringe hard enough to move the plunger  This creates the correct pressure without guessing how hard to push  Why its used  Stop certain fast heart rhythms (like supraventricular tachycardia, SVT)   When it helps with fast heart rate  The maneuver stimulates the vagus nerve, which can slow electrical signals in the heart and  sometimes restore a normal rhythm.  Important cautions  Dont strain too hard or too long  Avoid if you have:  Recent heart attack or stroke  Severe heart disease  Advanced eye disease (e.g., glaucoma)  If chest pain, dizziness, or fainting occurs ? stop immediately

## 2024-04-11 NOTE — Progress Notes (Signed)
" °  Cardiology Office Note   Date:  04/11/2024  ID:  Makala, Fetterolf 1970/05/11, MRN [id number] PCP: Melvin Pao, NP  [facility] Providers Cardiologist:  Caron Poser, MD     History of Present Illness Kari Brandt is a 54 y.o. female PMH HTN, DM 2, HLD, morbid obesity who presents for further evaluation management of SVT.  Seen in the ED for this issue 04/01/2024.  Was reportedly in and out of SVT spontaneously.  Ultimately treated with oral diltiazem  which seem to maintain sinus rhythm.  Troponin mildly elevated at 28.  CBC and BMP unremarkable.  Last LDL 70 12/2023.  Normal TSH 06/2023.  Since starting the diltiazem , patient reports that her symptoms have decreased quite a bit.  She is not really having significant burden of palpitations anymore.  Denies any other symptoms such as syncope or heart failure.  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  Studies Reviewed I have independently reviewed the patient's ECG, previous cardiac testing, previous medical records, previous blood work.  Physical Exam VS:  BP 118/70 (BP Location: Left Arm, Patient Position: Sitting, Cuff Size: Large)   Pulse 77   Ht 5' (1.524 m)   Wt 212 lb (96.2 kg)   SpO2 98%   BMI 41.40 kg/m        Wt Readings from Last 3 Encounters:  04/11/24 212 lb (96.2 kg)  04/01/24 210 lb (95.3 kg)  01/12/24 207 lb 6.4 oz (94.1 kg)    GEN: No acute distress. NECK: No JVD; No carotid bruits. CARDIAC: RRR, no murmurs, rubs, gallops. RESPIRATORY:  Clear to auscultation. EXTREMITIES:  Warm and  well-perfused. No edema.  ASSESSMENT AND PLAN SVT Paroxysmal tachycardia Palpitations Patient presents with paroxysmal tachycardia and palpitations found to have SVT at her recent ED visit.  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  ECG today shows sinus rhythm with PACs, so arrhythmia may have been triggered by ectopy.  She has fortunately done well with low-dose diltiazem  and not had recurrence of the severe symptoms that brought her to the ED.  Discussed pursuit of catheter ablation to which she is amenable.  Plan: - Referral to EP for consideration of catheter ablation - Zio monitor to evaluate SVT burden/recurrence - Echocardiogram to evaluate structural causes - Continue diltiazem  120 mg daily; uptitrate as needed. - Will send her home with a 10 cc syringe to assist with vagal maneuvers should she have recurrence at home  HLD Well-controlled, last LDL 70 12/2023.  Denies any obvious symptoms of angina.  Continue Crestor  10 mg daily.        Dispo: RTC 3 months or sooner as needed  Signed, Caron Poser, MD  "

## 2024-04-28 ENCOUNTER — Ambulatory Visit

## 2024-05-01 ENCOUNTER — Ambulatory Visit

## 2024-07-10 ENCOUNTER — Ambulatory Visit

## 2024-07-12 ENCOUNTER — Encounter: Admitting: Nurse Practitioner

## 2024-09-01 ENCOUNTER — Other Ambulatory Visit
